# Patient Record
Sex: Male | Born: 1935 | Race: White | Hispanic: No | Marital: Married
Health system: Southern US, Community
[De-identification: ages and names within clinical notes are randomized; demographics above are authoritative.]

## PROBLEM LIST (undated history)

## (undated) DIAGNOSIS — I4891 Unspecified atrial fibrillation: Secondary | ICD-10-CM

## (undated) DIAGNOSIS — F0390 Unspecified dementia without behavioral disturbance: Secondary | ICD-10-CM

## (undated) DIAGNOSIS — N183 Chronic kidney disease, stage 3 unspecified: Secondary | ICD-10-CM

## (undated) DIAGNOSIS — I10 Essential (primary) hypertension: Secondary | ICD-10-CM

---

## 2017-05-23 ENCOUNTER — Encounter (HOSPITAL_COMMUNITY): Payer: Self-pay | Admitting: Emergency Medicine

## 2017-05-23 ENCOUNTER — Inpatient Hospital Stay (HOSPITAL_COMMUNITY): Payer: Medicare Other

## 2017-05-23 ENCOUNTER — Inpatient Hospital Stay (HOSPITAL_COMMUNITY)
Admission: EM | Admit: 2017-05-23 | Discharge: 2017-05-26 | DRG: 536 | Disposition: A | Discharge status: Hospice | Payer: Medicare Other | Attending: Internal Medicine | Admitting: Internal Medicine

## 2017-05-23 ENCOUNTER — Emergency Department (HOSPITAL_COMMUNITY): Payer: Medicare Other

## 2017-05-23 DIAGNOSIS — R402142 Coma scale, eyes open, spontaneous, at arrival to emergency department: Secondary | ICD-10-CM | POA: Diagnosis present

## 2017-05-23 DIAGNOSIS — I480 Paroxysmal atrial fibrillation: Secondary | ICD-10-CM | POA: Diagnosis present

## 2017-05-23 DIAGNOSIS — N183 Chronic kidney disease, stage 3 (moderate): Secondary | ICD-10-CM | POA: Diagnosis present

## 2017-05-23 DIAGNOSIS — Z6824 Body mass index (BMI) 24.0-24.9, adult: Secondary | ICD-10-CM | POA: Diagnosis not present

## 2017-05-23 DIAGNOSIS — D696 Thrombocytopenia, unspecified: Secondary | ICD-10-CM | POA: Diagnosis present

## 2017-05-23 DIAGNOSIS — R627 Adult failure to thrive: Secondary | ICD-10-CM | POA: Diagnosis present

## 2017-05-23 DIAGNOSIS — I4891 Unspecified atrial fibrillation: Secondary | ICD-10-CM | POA: Diagnosis present

## 2017-05-23 DIAGNOSIS — R402242 Coma scale, best verbal response, confused conversation, at arrival to emergency department: Secondary | ICD-10-CM | POA: Diagnosis present

## 2017-05-23 DIAGNOSIS — Z66 Do not resuscitate: Secondary | ICD-10-CM | POA: Diagnosis present

## 2017-05-23 DIAGNOSIS — Z7401 Bed confinement status: Secondary | ICD-10-CM

## 2017-05-23 DIAGNOSIS — L8990 Pressure ulcer of unspecified site, unspecified stage: Secondary | ICD-10-CM | POA: Diagnosis present

## 2017-05-23 DIAGNOSIS — I129 Hypertensive chronic kidney disease with stage 1 through stage 4 chronic kidney disease, or unspecified chronic kidney disease: Secondary | ICD-10-CM | POA: Diagnosis present

## 2017-05-23 DIAGNOSIS — W19XXXA Unspecified fall, initial encounter: Secondary | ICD-10-CM | POA: Diagnosis present

## 2017-05-23 DIAGNOSIS — Z79899 Other long term (current) drug therapy: Secondary | ICD-10-CM | POA: Diagnosis not present

## 2017-05-23 DIAGNOSIS — Y92009 Unspecified place in unspecified non-institutional (private) residence as the place of occurrence of the external cause: Secondary | ICD-10-CM | POA: Diagnosis not present

## 2017-05-23 DIAGNOSIS — Y92129 Unspecified place in nursing home as the place of occurrence of the external cause: Secondary | ICD-10-CM | POA: Diagnosis not present

## 2017-05-23 DIAGNOSIS — Z515 Encounter for palliative care: Secondary | ICD-10-CM | POA: Diagnosis present

## 2017-05-23 DIAGNOSIS — S72001K Fracture of unspecified part of neck of right femur, subsequent encounter for closed fracture with nonunion: Secondary | ICD-10-CM | POA: Diagnosis not present

## 2017-05-23 DIAGNOSIS — S72001A Fracture of unspecified part of neck of right femur, initial encounter for closed fracture: Secondary | ICD-10-CM | POA: Diagnosis present

## 2017-05-23 DIAGNOSIS — F0390 Unspecified dementia without behavioral disturbance: Secondary | ICD-10-CM | POA: Diagnosis present

## 2017-05-23 DIAGNOSIS — Z888 Allergy status to other drugs, medicaments and biological substances status: Secondary | ICD-10-CM | POA: Diagnosis not present

## 2017-05-23 DIAGNOSIS — D62 Acute posthemorrhagic anemia: Secondary | ICD-10-CM | POA: Diagnosis present

## 2017-05-23 DIAGNOSIS — D649 Anemia, unspecified: Secondary | ICD-10-CM | POA: Diagnosis not present

## 2017-05-23 DIAGNOSIS — M25551 Pain in right hip: Secondary | ICD-10-CM | POA: Diagnosis present

## 2017-05-23 DIAGNOSIS — Z7901 Long term (current) use of anticoagulants: Secondary | ICD-10-CM

## 2017-05-23 DIAGNOSIS — S72011A Unspecified intracapsular fracture of right femur, initial encounter for closed fracture: Principal | ICD-10-CM | POA: Diagnosis present

## 2017-05-23 DIAGNOSIS — R402362 Coma scale, best motor response, obeys commands, at arrival to emergency department: Secondary | ICD-10-CM | POA: Diagnosis present

## 2017-05-23 DIAGNOSIS — G309 Alzheimer's disease, unspecified: Secondary | ICD-10-CM | POA: Diagnosis not present

## 2017-05-23 DIAGNOSIS — F0281 Dementia in other diseases classified elsewhere with behavioral disturbance: Secondary | ICD-10-CM | POA: Diagnosis not present

## 2017-05-23 HISTORY — DX: Chronic kidney disease, stage 3 (moderate): N18.3

## 2017-05-23 HISTORY — DX: Chronic kidney disease, stage 3 unspecified: N18.30

## 2017-05-23 HISTORY — DX: Unspecified atrial fibrillation: I48.91

## 2017-05-23 HISTORY — DX: Unspecified dementia, unspecified severity, without behavioral disturbance, psychotic disturbance, mood disturbance, and anxiety: F03.90

## 2017-05-23 HISTORY — DX: Essential (primary) hypertension: I10

## 2017-05-23 LAB — CBC WITH DIFFERENTIAL/PLATELET
BASOS ABS: 0 10*3/uL (ref 0.0–0.1)
BASOS PCT: 0 %
EOS ABS: 0 10*3/uL (ref 0.0–0.7)
Eosinophils Relative: 0 %
HCT: 41 % (ref 39.0–52.0)
HEMOGLOBIN: 13.1 g/dL (ref 13.0–17.0)
Lymphocytes Relative: 5 %
Lymphs Abs: 0.7 10*3/uL (ref 0.7–4.0)
MCH: 27.9 pg (ref 26.0–34.0)
MCHC: 32 g/dL (ref 30.0–36.0)
MCV: 87.4 fL (ref 78.0–100.0)
MONOS PCT: 4 %
Monocytes Absolute: 0.6 10*3/uL (ref 0.1–1.0)
NEUTROS ABS: 12.5 10*3/uL — AB (ref 1.7–7.7)
NEUTROS PCT: 91 %
Platelets: 156 10*3/uL (ref 150–400)
RBC: 4.69 MIL/uL (ref 4.22–5.81)
RDW: 14 % (ref 11.5–15.5)
WBC: 13.8 10*3/uL — AB (ref 4.0–10.5)

## 2017-05-23 LAB — BASIC METABOLIC PANEL
ANION GAP: 11 (ref 5–15)
BUN: 20 mg/dL (ref 6–20)
CALCIUM: 8.8 mg/dL — AB (ref 8.9–10.3)
CHLORIDE: 102 mmol/L (ref 101–111)
CO2: 24 mmol/L (ref 22–32)
CREATININE: 1.24 mg/dL (ref 0.61–1.24)
GFR calc Af Amer: >60 mL/min (ref >60)
GFR calc non Af Amer: 52 mL/min — ABNORMAL LOW (ref >60)
Glucose, Bld: 148 mg/dL — ABNORMAL HIGH (ref 65–99)
Potassium: 4.1 mmol/L (ref 3.5–5.1)
SODIUM: 137 mmol/L (ref 135–145)

## 2017-05-23 MED ORDER — SENNOSIDES-DOCUSATE SODIUM 8.6-50 MG PO TABS
2.0000 | ORAL_TABLET | Freq: Every day | ORAL | Status: DC
  Administered 2017-05-23 – 2017-05-25 (×3): 2 via ORAL
  Filled 2017-05-23 (×3): qty 2

## 2017-05-23 MED ORDER — FUROSEMIDE 20 MG PO TABS: 40.0000 mg | ORAL_TABLET | Freq: Every day | ORAL | Status: DC

## 2017-05-23 MED ORDER — ACETAMINOPHEN 325 MG PO TABS
650.0000 mg | ORAL_TABLET | Freq: Four times a day (QID) | ORAL | Status: DC | PRN
  Administered 2017-05-23 – 2017-05-24 (×2): 650 mg via ORAL
  Filled 2017-05-23 (×2): qty 2

## 2017-05-23 MED ORDER — RESOURCE THICKENUP CLEAR PO POWD
ORAL | Status: DC | PRN
  Filled 2017-05-23: qty 125

## 2017-05-23 MED ORDER — CARVEDILOL 3.125 MG PO TABS
3.1250 mg | ORAL_TABLET | Freq: Two times a day (BID) | ORAL | Status: DC
  Administered 2017-05-23 – 2017-05-26 (×7): 3.125 mg via ORAL
  Filled 2017-05-23 (×8): qty 1

## 2017-05-23 MED ORDER — ALLOPURINOL 100 MG PO TABS
100.0000 mg | ORAL_TABLET | Freq: Every day | ORAL | Status: DC
  Administered 2017-05-23 – 2017-05-26 (×4): 100 mg via ORAL
  Filled 2017-05-23 (×5): qty 1

## 2017-05-23 MED ORDER — FOLIC ACID 1 MG PO TABS
1.0000 mg | ORAL_TABLET | Freq: Every day | ORAL | Status: DC
  Administered 2017-05-23 – 2017-05-26 (×4): 1 mg via ORAL
  Filled 2017-05-23 (×4): qty 1

## 2017-05-23 MED ORDER — ENALAPRIL MALEATE 5 MG PO TABS
2.5000 mg | ORAL_TABLET | Freq: Every day | ORAL | Status: DC
  Administered 2017-05-23 – 2017-05-26 (×4): 2.5 mg via ORAL
  Filled 2017-05-23 (×5): qty 1

## 2017-05-23 MED ORDER — ONDANSETRON HCL 4 MG/2ML IJ SOLN: 4.0000 mg | Freq: Four times a day (QID) | INTRAMUSCULAR | Status: DC | PRN

## 2017-05-23 MED ORDER — MORPHINE SULFATE (PF) 4 MG/ML IV SOLN
2.0000 mg | INTRAVENOUS | Status: DC | PRN
  Administered 2017-05-23 – 2017-05-26 (×4): 2 mg via INTRAVENOUS
  Filled 2017-05-23 (×4): qty 1

## 2017-05-23 MED ORDER — PANTOPRAZOLE SODIUM 40 MG PO TBEC
40.0000 mg | DELAYED_RELEASE_TABLET | Freq: Every day | ORAL | Status: DC
  Administered 2017-05-23 – 2017-05-26 (×4): 40 mg via ORAL
  Filled 2017-05-23 (×4): qty 1

## 2017-05-23 MED ORDER — FENTANYL CITRATE (PF) 100 MCG/2ML IJ SOLN
50.0000 ug | Freq: Once | INTRAMUSCULAR | Status: AC
  Administered 2017-05-23: 50 ug via INTRAVENOUS
  Filled 2017-05-23: qty 2

## 2017-05-23 MED ORDER — LATANOPROST 0.005 % OP SOLN
1.0000 [drp] | Freq: Every day | OPHTHALMIC | Status: DC
  Administered 2017-05-23 – 2017-05-25 (×3): 1 [drp] via OPHTHALMIC
  Filled 2017-05-23: qty 2.5

## 2017-05-23 NOTE — Consult Note (Signed)
ORTHOPAEDIC CONSULTATION  REQUESTING PHYSICIAN: Zannie Cove, MD  PCP:  Patient, No Pcp Per  Chief Complaint: fall  HPI: Brandon Andersen is a 83 y.o. male who complains of right hip pain following a fall at his nursing facility. Brandon Andersen has fairly advanced dementia and the history is obtained from his wife was present at the bedside. He does have advanced dementia and lives in a memory care unit. This is been progressive over the last 10 years. Per his wife he is nonambulatory at baseline only participates in transfers from the bed to his recliner and from the bed to the commode. He has been nonambulatory since last year when he broke his left hip.  Current fall occurred when he tried to get out of bed on his own.  Of note he also has atrial fibrillation and is on Eliquis.  Past Medical History:  Diagnosis Date  . A-fib (HCC)   . Dementia   . Hypertension   . Renal disorder    CKD stage 3   History reviewed. No pertinent surgical history. Social History   Social History  . Marital status: Married    Spouse name: N/A  . Number of children: N/A  . Years of education: N/A   Social History Main Topics  . Smoking status: Unknown If Ever Smoked  . Smokeless tobacco: None  . Alcohol use No  . Drug use: No  . Sexual activity: Not Asked   Other Topics Concern  . None   Social History Narrative  . None   No family history on file. Allergies  Allergen Reactions  . Prednisone    Prior to Admission medications   Not on File   Dg Chest 1 View  Result Date: 05/23/2017 CLINICAL DATA:  Altered mental status.  Pain after a fall. EXAM: CHEST 1 VIEW COMPARISON:  04/14/2016 FINDINGS: Postoperative changes in the mediastinum. Heart size and pulmonary vascularity are normal for technique. Small left pleural effusion with atelectasis or consolidation in the left lung base. Pneumonia not excluded. Right lung appears clear and expanded. No pneumothorax. Calcified and tortuous  aorta. IMPRESSION: Small left pleural effusion with consolidation or atelectasis in the left lower lung. Electronically Signed   By: Burman Nieves M.D.   On: 05/23/2017 02:21   Dg Pelvis 1-2 Views  Result Date: 05/23/2017 CLINICAL DATA:  Pain after a fall tonight. EXAM: PELVIS - 1-2 VIEW COMPARISON:  None. FINDINGS: Diffuse bone demineralization. Acute subcapital fracture of the right proximal femur with varus angulation. Old left hip hemiarthroplasty. Components are incompletely included within the field of view. Pelvis appears intact. SI joints and symphysis pubis are not displaced. Vascular stents, vascular coils, and vascular calcifications are present in the pelvis. IMPRESSION: Transverse subcapital fracture of the proximal right femur with varus angulation. Electronically Signed   By: Burman Nieves M.D.   On: 05/23/2017 02:22   Ct Head Wo Contrast  Result Date: 05/23/2017 CLINICAL DATA:  Fall tonight.  Right femoral fracture.  Dementia. EXAM: CT HEAD WITHOUT CONTRAST CT CERVICAL SPINE WITHOUT CONTRAST TECHNIQUE: Multidetector CT imaging of the head and cervical spine was performed following the standard protocol without intravenous contrast. Multiplanar CT image reconstructions of the cervical spine were also generated. COMPARISON:  04/06/2016 FINDINGS: CT HEAD FINDINGS Brain: Examination is technically limited due to motion artifact. Diffuse cerebral atrophy. Ventricular dilatation consistent with central atrophy. Low-attenuation changes in the deep white matter consistent with small vessel ischemia. No mass effect or midline shift. No  abnormal extra-axial fluid collections. Gray-white matter junctions appear distinct. Basal cisterns are not effaced. No acute intracranial hemorrhage is identified. Vascular: Vascular calcifications are present. Skull: Calvarium appears intact. Sinuses/Orbits: Paranasal sinuses and mastoid air cells are clear. Other: None. CT CERVICAL SPINE FINDINGS Alignment:  There is reversal of the usual cervical lordosis with slight anterior subluxations at C3-4 and C4-5 and slight retrolisthesis at C5-6. Alignment is unchanged since the prior study and is likely due to degenerative changes. Normal alignment of the cervical facet joints. Skull base and vertebrae: Skullbase appears intact. No vertebral compression deformities. No focal bone lesion or bone destruction. Soft tissues and spinal canal: No prevertebral soft tissue swelling. No paraspinal mass or infiltration. Disc levels: Degenerative changes diffusely throughout the cervical spine with narrowed interspaces and associated endplate hypertrophic changes. Degenerative changes throughout the facet joints. C1-2 articulation appears intact. Upper chest: Prominent pleural effusion and pleural thickening similar to previous study dated 08/15/2016. Postoperative median sternotomy. Other: None. IMPRESSION: 1. No acute intracranial abnormalities. Chronic atrophy and small vessel ischemic changes. 2. Degenerative changes throughout the cervical spine. Nonspecific reversal of the usual cervical lordosis. Alignment is unchanged since previous studies. No acute displaced cervical spine fractures identified. 3. Left pleural effusion and pleural thickening is similar to previous studies. Electronically Signed   By: Burman Nieves M.D.   On: 05/23/2017 02:44   Ct Cervical Spine Wo Contrast  Result Date: 05/23/2017 CLINICAL DATA:  Fall tonight.  Right femoral fracture.  Dementia. EXAM: CT HEAD WITHOUT CONTRAST CT CERVICAL SPINE WITHOUT CONTRAST TECHNIQUE: Multidetector CT imaging of the head and cervical spine was performed following the standard protocol without intravenous contrast. Multiplanar CT image reconstructions of the cervical spine were also generated. COMPARISON:  04/06/2016 FINDINGS: CT HEAD FINDINGS Brain: Examination is technically limited due to motion artifact. Diffuse cerebral atrophy. Ventricular dilatation  consistent with central atrophy. Low-attenuation changes in the deep white matter consistent with small vessel ischemia. No mass effect or midline shift. No abnormal extra-axial fluid collections. Gray-white matter junctions appear distinct. Basal cisterns are not effaced. No acute intracranial hemorrhage is identified. Vascular: Vascular calcifications are present. Skull: Calvarium appears intact. Sinuses/Orbits: Paranasal sinuses and mastoid air cells are clear. Other: None. CT CERVICAL SPINE FINDINGS Alignment: There is reversal of the usual cervical lordosis with slight anterior subluxations at C3-4 and C4-5 and slight retrolisthesis at C5-6. Alignment is unchanged since the prior study and is likely due to degenerative changes. Normal alignment of the cervical facet joints. Skull base and vertebrae: Skullbase appears intact. No vertebral compression deformities. No focal bone lesion or bone destruction. Soft tissues and spinal canal: No prevertebral soft tissue swelling. No paraspinal mass or infiltration. Disc levels: Degenerative changes diffusely throughout the cervical spine with narrowed interspaces and associated endplate hypertrophic changes. Degenerative changes throughout the facet joints. C1-2 articulation appears intact. Upper chest: Prominent pleural effusion and pleural thickening similar to previous study dated 08/15/2016. Postoperative median sternotomy. Other: None. IMPRESSION: 1. No acute intracranial abnormalities. Chronic atrophy and small vessel ischemic changes. 2. Degenerative changes throughout the cervical spine. Nonspecific reversal of the usual cervical lordosis. Alignment is unchanged since previous studies. No acute displaced cervical spine fractures identified. 3. Left pleural effusion and pleural thickening is similar to previous studies. Electronically Signed   By: Burman Nieves M.D.   On: 05/23/2017 02:44   Ct Hip Right Wo Contrast  Result Date: 05/23/2017 CLINICAL DATA:   Right hip fracture for additional evaluation. EXAM: CT OF THE RIGHT HIP WITHOUT  CONTRAST TECHNIQUE: Multidetector CT imaging of the right hip was performed according to the standard protocol. Multiplanar CT image reconstructions were also generated. COMPARISON:  Pelvis and right femoral radiographs 05/23/2017 FINDINGS: Bones/Joint/Cartilage Transverse subcapital fracture of the right femoral neck with varus and posterior angulation of the distal fracture fragment. Fracture lines do not extend to the inter trochanteric region. Visualized right hemipelvis and acetabulum appear intact. Incidental note of a left hip arthroplasty. Ligaments Suboptimally assessed by CT. Muscles and Tendons No intramuscular hematoma. Soft tissues Small right hip effusion. Vascular calcifications in the iliac and femoral artery's. Metallic structure demonstrated in the right pelvis. Bladder wall is not thickened and no bladder filling defects are identified. IMPRESSION: Subcapital fracture of the right femoral neck with varus angulation. Electronically Signed   By: Burman NievesWilliam  Stevens M.D.   On: 05/23/2017 06:43   Dg Femur Min 2 Views Right  Result Date: 05/23/2017 CLINICAL DATA:  Altered mental status.  Right leg pain after a fall. EXAM: RIGHT FEMUR 2 VIEWS COMPARISON:  None. FINDINGS: Diffuse bone demineralization. Subcapital fracture of the right femoral neck with varus angulation of the fracture fragments. Right femur appears otherwise intact. Degenerative changes in the right hip joint. Right hemipelvis appears intact. Vascular stents, vascular calcifications, and vascular coils in the pelvis. IMPRESSION: Acute subcapital fracture of the right femoral neck with varus angulation. Electronically Signed   By: Burman NievesWilliam  Stevens M.D.   On: 05/23/2017 02:19    Positive ROS: All other systems have been reviewed and were otherwise negative with the exception of those mentioned in the HPI and as above.  Physical Exam: General:  disoriented, no acute distress Cardiovascular: No pedal edema Respiratory: No cyanosis, no use of accessory musculature GI: No organomegaly, abdomen is soft and non-tender Skin: No lesions in the area of chief complaint Neurologic: Sensation intact distally Psychiatric: Patient is not competent for consent  Lymphatic: No axillary or cervical lymphadenopathy  MUSCULOSKELETAL:  At the right hip he does not respond to logroll with any grimace or pain response. Distally at the foot he has a 2+ dorsalis pedis pulse in the foot is warm and well-perfused. He does not have pain with passive range of motion of the ankle foot or knee. The calf is soft and nontender.  Assessment: 1. Closed right femoral neck fracture.  Plan: - I had a long discussion at the bedside with Mr. Jinny SandersDavis's wife. We discussed that given his nonambulatory status this is a fracture that we wouldrecommend nonoperative management for. We reviewed at length his functional status to ensure that she was on board with this plan. The risks of surgery are outweighed by the small benefits of operating on a nonambulatory patient with a hip fracture. We also reviewed the CT scan of the right hip at bedside to see if he would be appropriate for a less invasive surgery to help with pain. Unfortunately the nature of the fracture would not be receptive to her cutaneous screws. - Thus our recommendation at this time is for pain control over the next 1-2 days to ensure that he will be appropriate for return back to his facility.  If for whatever reason he is unable to have satisfactory pain control while here we would consider changing plans to operative management on Monday. However I do believe that the risk of surgery in this individual is not outweighed by the small benefit. - recommend nonweightbearing to the right lower extremity, he is okay for transfers, and sitting upright. - We will  follow along.     Yolonda Kida, MD Cell [phone]    05/23/2017 7:01 AM

## 2017-05-23 NOTE — Progress Notes (Signed)
Patient seen and examined, admitted earlier this morning by Dr. Julian ReilGardner Basically 83 year old male with history of advanced dementia, overall debility/bedbound status, long term resident of [facility] since his previous hip fracture, atrial fibrillation, chronic kidney disease stage III was admitted after a fall and x-ray which noted nondisplaced right femur fracture. -Orthopedics consulted, seen by Dr. Duwayne HeckJason Rogers, who felt that given patient's nonambulatory status the risks of surgery would be outweighed by the small benefits and hence recommended nonoperative management, pain control and nonweightbearing right lower extremity, okay for transfers etc. -I had a long discussion with patient's wife Mrs. Kling regarding overall debility, advanced dementia, bedbound status and recommended a palliative consultation for goals of care she is agreeable to this. -Patient is DNR  Brandon CovePreetha Ernisha Sorn, MD

## 2017-05-23 NOTE — ED Notes (Signed)
Attempted to call report

## 2017-05-23 NOTE — Progress Notes (Signed)
Palliative Medicine consult noted. Due to high referral volume, there may be a delay seeing this patient. Please call the Palliative Medicine Team office at [phone] if recommendations are needed in the interim.  Thank you for inviting us to see this patient.  Jenan Ellegood G. Finnleigh Marchetti, RN, BSN, CHPN 05/23/2017 4:03 PM Cell [phone] 8:00-4:00 Monday-Friday Office [phone] 

## 2017-05-23 NOTE — ED Provider Notes (Signed)
[facility] Provider Note   CSN: [id number] Arrival date & time: 05/23/17  0055     History   Chief Complaint Chief Complaint  Patient presents with  . Fall    HPI Brandon HandlerDonald L Hilscher is a 83 y.o. male.  Patient with past medical history remarkable for dementia, hypertension, [facility] ED, and A. Fib on eliquis presents to the ED with a chief complaint of fall.  He stays at a memory unit in a nursing home and fell from standing after trying to get out of his wheel chair.  He reportedly had an x-ray of his right thigh that showed a femur fracture.  He states that he doesn't have any pain.  He is accompanied by family members.  He is DNR.  Hx is limited 2/2 dementia.  Level 5 caveat applies.   The history is provided by the patient. No language interpreter was used.    Past Medical History:  Diagnosis Date  . A-fib (HCC)   . Dementia   . Hypertension   . Renal disorder    CKD stage 3    There are no active problems to display for this patient.   History reviewed. No pertinent surgical history.     Home Medications    Prior to Admission medications   Not on File    Family History No family history on file.  Social History Social History  Substance Use Topics  . Smoking status: Unknown If Ever Smoked  . Smokeless tobacco: Not on file  . Alcohol use No     Allergies   Prednisone   Review of Systems Review of Systems  Unable to perform ROS: Dementia     Physical Exam Updated Vital Signs BP 115/72 (BP Location: Right Arm)   Pulse (!) 117   Temp 97.6 F (36.4 C) (Oral)   Resp 16   SpO2 95%   Physical Exam  Constitutional: He is oriented to person, place, and time. He appears well-developed and well-nourished.  HENT:  Head: Normocephalic and atraumatic.  Eyes: Pupils are equal, round, and reactive to light. Conjunctivae and EOM are normal. Right eye exhibits no discharge. Left eye exhibits no discharge. No scleral  icterus.  Neck: Normal range of motion. Neck supple. No JVD present.  Cardiovascular: Regular rhythm, normal heart sounds and intact distal pulses.  Exam reveals no gallop and no friction rub.   No murmur heard. tachycardic  Pulmonary/Chest: Effort normal and breath sounds normal. No respiratory distress. He has no wheezes. He has no rales. He exhibits no tenderness.  Abdominal: Soft. He exhibits no distension and no mass. There is no tenderness. There is no rebound and no guarding.  Musculoskeletal: Normal range of motion. He exhibits no edema or tenderness.  Right lower extremity externally rotated and shortened ROM and strength deferred  Neurological: He is alert and oriented to person, place, and time.  Sensation intact  Skin: Skin is warm and dry.  No open fracture  Psychiatric: He has a normal mood and affect. His behavior is normal. Judgment and thought content normal.  Nursing note and vitals reviewed.    ED Treatments / Results  Labs (all labs ordered are listed, but only abnormal results are displayed) Labs Reviewed  CBC WITH DIFFERENTIAL/PLATELET  BASIC METABOLIC PANEL    EKG  EKG Interpretation None       Radiology No results found.  Procedures Procedures (including critical care time)  Medications Ordered in ED Medications -  No data to display   Initial Impression / Assessment and Plan / ED Course  I have reviewed the triage vital signs and the nursing notes.  Pertinent labs & imaging results that were available during my care of the patient were reviewed by me and considered in my medical decision making (see chart for details).     Patient with mechanical fall.  Reported femur fracture.  Will check labs and imaging.  Right-sided subcapital femur fracture. No other apparent injuries.  Patient discussed with Dr. Aundria Rud from orthopedics, who recommends hospitalist admission.  Appreciate Dr. Julian Reil for admitting the patient.  Final Clinical  Impressions(s) / ED Diagnoses   Final diagnoses:  Closed subcapital fracture of right femur, initial encounter [facility])    New Prescriptions New Prescriptions   No medications on file     Roxy Horseman, PA-C 05/23/17 0344    Derwood Kaplan, MD 05/23/17 [phone]

## 2017-05-23 NOTE — ED Triage Notes (Signed)
Per EMS, pt from [facility] after fall tonight. Xray from facility shows nondisplaced right femur fx. Hx dementia. VSS.

## 2017-05-23 NOTE — Progress Notes (Signed)
Initial Nutrition Assessment  DOCUMENTATION CODES:   Not applicable  INTERVENTION:  Provide Magic cup TID with meals, each supplement provides 290 kcal and 9 grams of protein.  Recommend providing new height and weight measurement.   Encourage adequate PO intake.   NUTRITION DIAGNOSIS:   Increased nutrient needs related to acute illness as evidenced by estimated needs.  GOAL:   Patient will meet greater than or equal to 90% of their needs  MONITOR:   PO intake, Supplement acceptance, Labs, Weight trends, Skin, I & O's  REASON FOR ASSESSMENT:   Consult Hip fracture protocol  ASSESSMENT:   83 year old male with history of advanced dementia, overall debility/bedbound status, long term resident of [facility] since his previous hip fracture, atrial fibrillation, chronic kidney disease stage III was admitted after a fall and x-ray which noted nondisplaced right femur fracture.   Orthopedics MD recommends non operative management. No family at bedside. Pt baseline confused per RN. Pt did reports to RD that he is eating well at meals. Pt currently on a dysphagia 1 diet with honey thick liquids. RD to order Magic cup to aid in adequate nutrition. Noted no new weight or height measurement. Recommend obtaining weight and height.  Nutrition-Focused physical exam completed. Findings are mild to moderate fat depletion, no muscle depletion, and no edema.   Labs and medications reviewed.   Diet Order:  DIET - DYS 1 Room service appropriate? Yes; Fluid consistency: Honey Thick  Skin:  Wound (see comment) (Stage I to L heel)  Last BM:  Unknown  Height:   Ht Readings from Last 1 Encounters:  No data found for Ht    Weight:   Wt Readings from Last 1 Encounters:  No data found for Wt    Ideal Body Weight:    N/A  BMI:  There is no height or weight on file to calculate BMI.  Estimated Nutritional Needs:   Kcal:  1800-1950  Protein:  70-80 grams  Fluid:  1.8 - 1.9  L/day  EDUCATION NEEDS:   No education needs identified at this time  Roslyn SmilingStephanie Jerianne Anselmo, MS, RD, LDN Pager # [phone] After hours/ weekend pager # [phone]

## 2017-05-23 NOTE — H&P (Signed)
History and Physical    Brandon Andersen ZOX:[id number] DOB: June 14, 1932 DOA: 05/23/2017  PCP: Patient, No Pcp Per  Patient coming from: [facility]  I have personally briefly reviewed patient's old medical records in [facility]  Chief Complaint: Fall  HPI: Brandon Andersen is a 83 y.o. male with medical history significant of Dementia, a.fib, HTN, CKD stage 3.  Patient presents to ED from his memory care unit at [facility] after a fall tonight.  X ray from facility shows R femoral neck fx.   ED Course: R hip fx confirmed on our X ray.   Review of Systems: Limited secondary to dementia.  Denies pain right now.  Past Medical History:  Diagnosis Date  . A-fib (HCC)   . Dementia   . Hypertension   . Renal disorder    CKD stage 3    History reviewed. No pertinent surgical history.   reports that he does not drink alcohol or use drugs. His tobacco history is not on file.  Allergies  Allergen Reactions  . Prednisone     No family history on file.   Prior to Admission medications   Not on File    Physical Exam: Vitals:   05/23/17 0108 05/23/17 0109 05/23/17 0224 05/23/17 0302  BP: 115/72  125/81 127/79  Pulse: (!) 117  (!) 40   Resp: 16  (!) 22 (!) 24  Temp: 97.6 F (36.4 C)     TempSrc: Oral     SpO2: (!) 88% 95% 93% 94%    Constitutional: NAD, calm, comfortable Eyes: PERRL, lids and conjunctivae normal ENMT: Mucous membranes are moist. Posterior pharynx clear of any exudate or lesions.Normal dentition.  Neck: normal, supple, no masses, no thyromegaly Respiratory: clear to auscultation bilaterally, no wheezing, no crackles. Normal respiratory effort. No accessory muscle use.  Cardiovascular: Tachycardic, no murmurs / rubs / gallops. No extremity edema. 2+ pedal pulses. No carotid bruits.  Abdomen: no tenderness, no masses palpated. No hepatosplenomegaly. Bowel sounds positive.  Musculoskeletal: RLE shortened and rotated Skin: no rashes, lesions, ulcers. No  induration Neurologic: Sensation intact Psychiatric: Demented    Labs on Admission: I have personally reviewed following labs and imaging studies  CBC:  Recent Labs Lab 05/23/17 0110  WBC 13.8*  NEUTROABS 12.5*  HGB 13.1  HCT 41.0  MCV 87.4  PLT 156   Basic Metabolic Panel:  Recent Labs Lab 05/23/17 0110  NA 137  K 4.1  CL 102  CO2 24  GLUCOSE 148*  BUN 20  CREATININE 1.24  CALCIUM 8.8*   GFR: CrCl cannot be calculated (Unknown ideal weight.). Liver Function Tests: No results for input(s): AST, ALT, ALKPHOS, BILITOT, PROT, ALBUMIN in the last 168 hours. No results for input(s): LIPASE, AMYLASE in the last 168 hours. No results for input(s): AMMONIA in the last 168 hours. Coagulation Profile: No results for input(s): INR, PROTIME in the last 168 hours. Cardiac Enzymes: No results for input(s): CKTOTAL, CKMB, CKMBINDEX, TROPONINI in the last 168 hours. BNP (last 3 results) No results for input(s): PROBNP in the last 8760 hours. HbA1C: No results for input(s): HGBA1C in the last 72 hours. CBG: No results for input(s): GLUCAP in the last 168 hours. Lipid Profile: No results for input(s): CHOL, HDL, LDLCALC, TRIG, CHOLHDL, LDLDIRECT in the last 72 hours. Thyroid Function Tests: No results for input(s): TSH, T4TOTAL, FREET4, T3FREE, THYROIDAB in the last 72 hours. Anemia Panel: No results for input(s): VITAMINB12, FOLATE, FERRITIN, TIBC, IRON, RETICCTPCT in the last  72 hours. Urine analysis: No results found for: COLORURINE, APPEARANCEUR, LABSPEC, PHURINE, GLUCOSEU, HGBUR, BILIRUBINUR, KETONESUR, PROTEINUR, UROBILINOGEN, NITRITE, LEUKOCYTESUR  Radiological Exams on Admission: Dg Chest 1 View  Result Date: 05/23/2017 CLINICAL DATA:  Altered mental status.  Pain after a fall. EXAM: CHEST 1 VIEW COMPARISON:  04/14/2016 FINDINGS: Postoperative changes in the mediastinum. Heart size and pulmonary vascularity are normal for technique. Small left pleural effusion with  atelectasis or consolidation in the left lung base. Pneumonia not excluded. Right lung appears clear and expanded. No pneumothorax. Calcified and tortuous aorta. IMPRESSION: Small left pleural effusion with consolidation or atelectasis in the left lower lung. Electronically Signed   By: Burman Nieves M.D.   On: 05/23/2017 02:21   Dg Pelvis 1-2 Views  Result Date: 05/23/2017 CLINICAL DATA:  Pain after a fall tonight. EXAM: PELVIS - 1-2 VIEW COMPARISON:  None. FINDINGS: Diffuse bone demineralization. Acute subcapital fracture of the right proximal femur with varus angulation. Old left hip hemiarthroplasty. Components are incompletely included within the field of view. Pelvis appears intact. SI joints and symphysis pubis are not displaced. Vascular stents, vascular coils, and vascular calcifications are present in the pelvis. IMPRESSION: Transverse subcapital fracture of the proximal right femur with varus angulation. Electronically Signed   By: Burman Nieves M.D.   On: 05/23/2017 02:22   Ct Head Wo Contrast  Result Date: 05/23/2017 CLINICAL DATA:  Fall tonight.  Right femoral fracture.  Dementia. EXAM: CT HEAD WITHOUT CONTRAST CT CERVICAL SPINE WITHOUT CONTRAST TECHNIQUE: Multidetector CT imaging of the head and cervical spine was performed following the standard protocol without intravenous contrast. Multiplanar CT image reconstructions of the cervical spine were also generated. COMPARISON:  04/06/2016 FINDINGS: CT HEAD FINDINGS Brain: Examination is technically limited due to motion artifact. Diffuse cerebral atrophy. Ventricular dilatation consistent with central atrophy. Low-attenuation changes in the deep white matter consistent with small vessel ischemia. No mass effect or midline shift. No abnormal extra-axial fluid collections. Gray-white matter junctions appear distinct. Basal cisterns are not effaced. No acute intracranial hemorrhage is identified. Vascular: Vascular calcifications are  present. Skull: Calvarium appears intact. Sinuses/Orbits: Paranasal sinuses and mastoid air cells are clear. Other: None. CT CERVICAL SPINE FINDINGS Alignment: There is reversal of the usual cervical lordosis with slight anterior subluxations at C3-4 and C4-5 and slight retrolisthesis at C5-6. Alignment is unchanged since the prior study and is likely due to degenerative changes. Normal alignment of the cervical facet joints. Skull base and vertebrae: Skullbase appears intact. No vertebral compression deformities. No focal bone lesion or bone destruction. Soft tissues and spinal canal: No prevertebral soft tissue swelling. No paraspinal mass or infiltration. Disc levels: Degenerative changes diffusely throughout the cervical spine with narrowed interspaces and associated endplate hypertrophic changes. Degenerative changes throughout the facet joints. C1-2 articulation appears intact. Upper chest: Prominent pleural effusion and pleural thickening similar to previous study dated 08/15/2016. Postoperative median sternotomy. Other: None. IMPRESSION: 1. No acute intracranial abnormalities. Chronic atrophy and small vessel ischemic changes. 2. Degenerative changes throughout the cervical spine. Nonspecific reversal of the usual cervical lordosis. Alignment is unchanged since previous studies. No acute displaced cervical spine fractures identified. 3. Left pleural effusion and pleural thickening is similar to previous studies. Electronically Signed   By: Burman Nieves M.D.   On: 05/23/2017 02:44   Ct Cervical Spine Wo Contrast  Result Date: 05/23/2017 CLINICAL DATA:  Fall tonight.  Right femoral fracture.  Dementia. EXAM: CT HEAD WITHOUT CONTRAST CT CERVICAL SPINE WITHOUT CONTRAST TECHNIQUE: Multidetector CT imaging  of the head and cervical spine was performed following the standard protocol without intravenous contrast. Multiplanar CT image reconstructions of the cervical spine were also generated. COMPARISON:   04/06/2016 FINDINGS: CT HEAD FINDINGS Brain: Examination is technically limited due to motion artifact. Diffuse cerebral atrophy. Ventricular dilatation consistent with central atrophy. Low-attenuation changes in the deep white matter consistent with small vessel ischemia. No mass effect or midline shift. No abnormal extra-axial fluid collections. Gray-white matter junctions appear distinct. Basal cisterns are not effaced. No acute intracranial hemorrhage is identified. Vascular: Vascular calcifications are present. Skull: Calvarium appears intact. Sinuses/Orbits: Paranasal sinuses and mastoid air cells are clear. Other: None. CT CERVICAL SPINE FINDINGS Alignment: There is reversal of the usual cervical lordosis with slight anterior subluxations at C3-4 and C4-5 and slight retrolisthesis at C5-6. Alignment is unchanged since the prior study and is likely due to degenerative changes. Normal alignment of the cervical facet joints. Skull base and vertebrae: Skullbase appears intact. No vertebral compression deformities. No focal bone lesion or bone destruction. Soft tissues and spinal canal: No prevertebral soft tissue swelling. No paraspinal mass or infiltration. Disc levels: Degenerative changes diffusely throughout the cervical spine with narrowed interspaces and associated endplate hypertrophic changes. Degenerative changes throughout the facet joints. C1-2 articulation appears intact. Upper chest: Prominent pleural effusion and pleural thickening similar to previous study dated 08/15/2016. Postoperative median sternotomy. Other: None. IMPRESSION: 1. No acute intracranial abnormalities. Chronic atrophy and small vessel ischemic changes. 2. Degenerative changes throughout the cervical spine. Nonspecific reversal of the usual cervical lordosis. Alignment is unchanged since previous studies. No acute displaced cervical spine fractures identified. 3. Left pleural effusion and pleural thickening is similar to previous  studies. Electronically Signed   By: Burman NievesWilliam  Stevens M.D.   On: 05/23/2017 02:44   Dg Femur Min 2 Views Right  Result Date: 05/23/2017 CLINICAL DATA:  Altered mental status.  Right leg pain after a fall. EXAM: RIGHT FEMUR 2 VIEWS COMPARISON:  None. FINDINGS: Diffuse bone demineralization. Subcapital fracture of the right femoral neck with varus angulation of the fracture fragments. Right femur appears otherwise intact. Degenerative changes in the right hip joint. Right hemipelvis appears intact. Vascular stents, vascular calcifications, and vascular coils in the pelvis. IMPRESSION: Acute subcapital fracture of the right femoral neck with varus angulation. Electronically Signed   By: Burman NievesWilliam  Stevens M.D.   On: 05/23/2017 02:19    EKG: Independently reviewed.  Assessment/Plan Principal Problem:   Closed right hip fracture, initial encounter (HCC) Active Problems:   Dementia   A-fib (HCC)    1. Closed R hip fx - 1. Hip fx pathway 2. Pain control with IV morphine PRN 3. Holding eliquis, surgery likely wont be today due to eliquis use per EDP discussion with Dr. Aundria Rudogers 4. Goal of repair would be to increase bed mobility and transfers, he currently does not ambulate at baseline since he broke the other hip last year. 2. Dementia - 1. Chronic, stable, in memory care unit at baseline 3. A.Fib - 1. Continue coreg 2. Hold eliquis 4. HTN - 1. continue vasotec 2. will hold lasix for now  DVT prophylaxis: SCDs Code Status: DNR Family Communication: Family at bedside Disposition Plan: SNF after admit Consults called: EDP spoke with Dr. Aundria Rudogers Admission status: Admit to inpatient   Hillary BowGARDNER, Stefon Ramthun M. DO [facility] Pager [phone]  If 7AM-7PM, please contact day team taking care of patient [email or web address] Password [email or web address]  05/23/2017, 4:24 AM

## 2017-05-24 DIAGNOSIS — Z515 Encounter for palliative care: Secondary | ICD-10-CM

## 2017-05-24 DIAGNOSIS — G309 Alzheimer's disease, unspecified: Secondary | ICD-10-CM

## 2017-05-24 DIAGNOSIS — I480 Paroxysmal atrial fibrillation: Secondary | ICD-10-CM

## 2017-05-24 DIAGNOSIS — D696 Thrombocytopenia, unspecified: Secondary | ICD-10-CM

## 2017-05-24 DIAGNOSIS — S72001K Fracture of unspecified part of neck of right femur, subsequent encounter for closed fracture with nonunion: Secondary | ICD-10-CM

## 2017-05-24 DIAGNOSIS — F0281 Dementia in other diseases classified elsewhere with behavioral disturbance: Secondary | ICD-10-CM

## 2017-05-24 DIAGNOSIS — D62 Acute posthemorrhagic anemia: Secondary | ICD-10-CM

## 2017-05-24 DIAGNOSIS — I4891 Unspecified atrial fibrillation: Secondary | ICD-10-CM

## 2017-05-24 DIAGNOSIS — S72001A Fracture of unspecified part of neck of right femur, initial encounter for closed fracture: Secondary | ICD-10-CM

## 2017-05-24 LAB — BASIC METABOLIC PANEL
ANION GAP: 7 (ref 5–15)
BUN: 18 mg/dL (ref 6–20)
CALCIUM: 8.6 mg/dL — AB (ref 8.9–10.3)
CO2: 28 mmol/L (ref 22–32)
CREATININE: 1.25 mg/dL — AB (ref 0.61–1.24)
Chloride: 103 mmol/L (ref 101–111)
GFR calc Af Amer: 60 mL/min — ABNORMAL LOW (ref >60)
GFR calc non Af Amer: 51 mL/min — ABNORMAL LOW (ref >60)
GLUCOSE: 112 mg/dL — AB (ref 65–99)
Potassium: 4.3 mmol/L (ref 3.5–5.1)
Sodium: 138 mmol/L (ref 135–145)

## 2017-05-24 LAB — CBC
HEMATOCRIT: 36.9 % — AB (ref 39.0–52.0)
Hemoglobin: 11.6 g/dL — ABNORMAL LOW (ref 13.0–17.0)
MCH: 27.8 pg (ref 26.0–34.0)
MCHC: 31.4 g/dL (ref 30.0–36.0)
MCV: 88.3 fL (ref 78.0–100.0)
Platelets: 134 10*3/uL — ABNORMAL LOW (ref 150–400)
RBC: 4.18 MIL/uL — ABNORMAL LOW (ref 4.22–5.81)
RDW: 14.2 % (ref 11.5–15.5)
WBC: 8.6 10*3/uL (ref 4.0–10.5)

## 2017-05-24 MED ORDER — APIXABAN 2.5 MG PO TABS
2.5000 mg | ORAL_TABLET | Freq: Two times a day (BID) | ORAL | Status: DC
  Administered 2017-05-24 – 2017-05-26 (×5): 2.5 mg via ORAL
  Filled 2017-05-24 (×5): qty 1

## 2017-05-24 MED ORDER — FUROSEMIDE 40 MG PO TABS
40.0000 mg | ORAL_TABLET | Freq: Every day | ORAL | Status: DC
  Administered 2017-05-24 – 2017-05-26 (×3): 40 mg via ORAL
  Filled 2017-05-24 (×3): qty 1

## 2017-05-24 NOTE — Care Management Note (Signed)
Case Management Note  Patient Details  Name: Brandon Andersen MRN: [id number] Date of Birth: 11/28/1931  Subjective/Objective:                 Patient admitted from [facility], with medical history significant of Dementia, a.fib, HTN, CKD stage 3.  Patient presents to ED [facility] after a [facility] tonight.  X ray from facility shows R femoral neck fx.   Action/Plan:  CM/ CSW will continue to follow for DC planning.  Expected Discharge Date:                  Expected Discharge Plan:  Skilled Nursing Facility  In-House Referral:  Clinical Social Work  Discharge planning Services  CM Consult  Post Acute Care Choice:    Choice offered to:     DME Arranged:    DME Agency:     HH Arranged:    HH Agency:     Status of Service:  In process, will continue to follow  If discussed at Long Length of Stay Meetings, dates discussed:    Additional Comments:  Lawerance SabalDebbie Tulip Meharg, RN 05/24/2017, 8:37 AM

## 2017-05-24 NOTE — Consult Note (Signed)
WOC Nurse wound consult note Reason for Consult:Deep tissue injury to bilateral buttock in fold. Wife at bedside and states [facility] has been treating him for pressure injuries in this area.  Wounds are intact, maroon discoloration.   Wound type:pressure Pressure Injury POA: Yes Measurement: 3 cm x 0.3 cm intact maroon discoloration with blistering Wound ZOX:WRUEAVbed:intact maroon Drainage (amount, consistency, odor) none Periwound:intact Dressing procedure/placement/frequency:Cleanse wounds to buttocks with soap and water daily.  Turn and reposition every two hours.  Will order mattress replacement with low air loss feature.  Will not follow at this time.  Please re-consult if needed.  Maple HudsonKaren Blessings Inglett RN BSN [facility]ON Pager [phone]

## 2017-05-24 NOTE — Progress Notes (Signed)
Noted bilateral buttocks with blister dark colored maroon, foam dressing applied.,WOC consult done.

## 2017-05-24 NOTE — Consult Note (Signed)
Consultation Note Date: 05/24/2017   Patient Name: Brandon Andersen  DOB: June 18, 1932  MRN: [id number]  Age / Sex: 83 y.o., male  PCP: Patient, No Pcp Per Referring Physician: Modena Jansky, MD  Reason for Consultation: Establishing goals of care, Hospice Evaluation, Pain control and Psychosocial/spiritual support  HPI/Patient Profile: 83 y.o. male  with past medical history of Advanced dementia, debility/bedbound status, history of left hip fracture,, atrial fibrillation on anticoagulation admitted on 05/23/2017 after a fall where he sustained a right hip fracture. Patient is a resident of a skilled nursing facility because of advanced dementia. When he broke his left hip he has not walked since that time. He is minimally conversant. Orthopedic  services were notified. Family does not wish to pursue surgery given his bedbound status as well as overall poor quality of life secondary to dementia  Consult for goals of care  Clinical Assessment and Goals of Care: Met with patient, patient's wife as well as review chart. Patient is unable to participate in goals of care secondary to advanced dementia. Spoke at length to patient's wife who shares that his disease progression with dementia has been long and very difficult. As noted above, patient fell and broke his left hip and has been living in a nursing home since that time. He did have his hip repaired but still never regained or improved his functional status and has been bed bound since. He has now fallen again, and sustained a right hip fracture. Family does not wish to pursue surgery. Patient is also eating only bites and sips of pured food. Patient appears to be in a great deal of pain with any sort of movement and is noticed to be grimacing at rest.  Patient's healthcare proxy is his wife, Brandon Andersen [phone] She and her husband have 4 children  together, 3 of which live locally and are described as a good source of support    SUMMARY OF RECOMMENDATIONS   Confirm DNR/DNI No surgical repair of right hip fracture. Medical management only with adequate analgesia, opioids as indicated Nursing care orders were written to  premedicate patient prior to ADLs, bathing secondary to pain No feeding tube Return to [facility] skilled nursing facility when medically maximized with hospice support in the facility Order placed to social work to facilitate hospice support upon discharge in skilled [place]:  DNR    Symptom Management:   Pain: Continue with morphine but strongly suggest the patient be premedicated prior to any ADLs. Patient is in a lot of pain with movement and is grimacing at rest. Continue morphine 2 mg IV every 4 hours as needed. Monitor for need for scheduled dosing  Palliative Prophylaxis:   Aspiration, Bowel Regimen, Delirium Protocol, Eye Care, Frequent Pain Assessment, Oral Care and Turn Reposition  Additional Recommendations (Limitations, Scope, Preferences):  Avoid Hospitalization, Minimize Medications, Initiate Comfort Feeding, No Artificial Feeding, No Chemotherapy, No Hemodialysis, No Radiation, No Surgical Procedures and No Tracheostomy  Psycho-social/Spiritual:   Desire for further Chaplaincy  support:no  Additional Recommendations: Grief/Bereavement Support  Prognosis:   < 6 months in the setting of advanced dementia now with new right hip fracture (no surgical repair), history of left hip fracture with surgical repair, atrial fib; only eating bites and sips  Discharge Planning: [place] with Hospice      Primary Diagnoses: Present on Admission: . Dementia . A-fib ([place]) . Closed right hip fracture, initial encounter ([place])   I have reviewed the medical record, interviewed the patient and family, and examined the patient. The following  aspects are pertinent.  Past Medical History:  Diagnosis Date  . A-fib ([place])   . CKD (chronic kidney disease), stage III ([place])   . Dementia   . Hypertension    Social History   Social History  . Marital status: Married    Spouse name: N/A  . Number of children: N/A  . Years of education: N/A   Social History Main Topics  . Smoking status: Unknown If Ever Smoked  . Smokeless tobacco: None  . Alcohol use No  . Drug use: No  . Sexual activity: Not Asked   Other Topics Concern  . None   Social History Narrative  . None   No family history on file. Scheduled Meds: . allopurinol  100 mg Oral Daily  . apixaban  2.5 mg Oral BID  . carvedilol  3.125 mg Oral BID WC  . enalapril  2.5 mg Oral Daily  . folic acid  1 mg Oral Daily  . furosemide  40 mg Oral Daily  . latanoprost  1 drop Both Eyes QHS  . pantoprazole  40 mg Oral Daily  . senna-docusate  2 tablet Oral QHS   Continuous Infusions: PRN Meds:.acetaminophen, morphine injection, ondansetron (ZOFRAN) IV, RESOURCE THICKENUP CLEAR Medications Prior to Admission:  Prior to Admission medications   Medication Sig Start Date End Date Taking? Authorizing Provider  acetaminophen (TYLENOL) 500 MG tablet Take 1,000 mg by mouth every 6 (six) hours as needed for mild pain.   Yes [provider]  allopurinol (ZYLOPRIM) 100 MG tablet Take 100 mg by mouth daily.   Yes [provider]  apixaban (ELIQUIS) 2.5 MG TABS tablet Take 2.5 mg by mouth 2 (two) times daily.   Yes [provider]  carvedilol (COREG) 3.125 MG tablet Take 3.125 mg by mouth 2 (two) times daily with a meal.   Yes [provider]  enalapril (VASOTEC) 2.5 MG tablet Take 2.5 mg by mouth daily.   Yes [provider]  folic acid (FOLVITE) 1 MG tablet Take 1 mg by mouth daily.   Yes [provider]  furosemide (LASIX) 40 MG tablet Take 40 mg by mouth daily.   Yes [provider]  ketoconazole (NIZORAL) 2 %  shampoo Apply 1 application topically 2 (two) times a week.   Yes [provider]  latanoprost (XALATAN) 0.005 % ophthalmic solution Place 1 drop into both eyes at bedtime.   Yes [provider]  Menthol-Zinc Oxide (CALMOSEPTINE) 0.44-20.6 % OINT Apply 1 application topically 3 (three) times daily.   Yes [provider]  nitroGLYCERIN (NITROSTAT) 0.4 MG SL tablet Place 0.4 mg under the tongue every 5 (five) minutes as needed for chest pain.   Yes [provider]  omeprazole (PRILOSEC) 20 MG capsule Take 20 mg by mouth daily.   Yes [provider]  senna-docusate (SENOKOT-S) 8.6-50 MG tablet Take 1 tablet by mouth daily.   Yes [provider]  Sodium Fluoride (PREVIDENT 5000 BOOSTER PLUS) 1.1 % PSTE Place 1 application onto teeth at bedtime.   Yes [provider]   Allergies  Allergen Reactions  . Prednisone    Review of Systems  Unable to perform ROS: Dementia    Physical Exam  Constitutional:  Cachectic, frail elderly man  HENT:  Temporal wasting  Cardiovascular: Normal rate.   Pulmonary/Chest: Effort normal.  Neurological:  Somnolent Cries out in pain with any turning in the bed Minimally conversant  Skin: Skin is warm and dry. There is pallor.  Psychiatric:  Unable to test secondary to end-stage dementia  Nursing note and vitals reviewed.   Vital Signs: BP 136/78 (BP Location: Left Arm)   Pulse (!) 111   Temp 98.6 F (37 C) (Axillary)   Resp 17   SpO2 91%  Pain Assessment: Faces   Pain Score: 0-No pain   SpO2: SpO2: 91 % O2 Device:SpO2: 91 % O2 Flow Rate: .O2 Flow Rate (L/min): 4.5 L/min  IO: Intake/output summary:  Intake/Output Summary (Last 24 hours) at 05/24/17 1633 Last data filed at 05/24/17 1300  Gross per 24 hour  Intake              230 ml  Output                0 ml  Net              230 ml    LBM:   Baseline Weight:   Most recent weight:       Palliative  Assessment/Data:   Flowsheet Rows     Most Recent Value  Intake Tab  Referral Department  Hospitalist  Unit at Time of Referral  Med/Surg Unit  Palliative Care Primary Diagnosis  Trauma  Date Notified  05/23/17  Palliative Care Type  New Palliative care  Reason for referral  Clarify Goals of Care, Counsel Regarding Hospice  Date of Admission  05/21/17  Date first seen by Palliative Care  05/24/17  # of days Palliative referral response time  1 Day(s)  # of days IP prior to Palliative referral  2  Clinical Assessment  Palliative Performance Scale Score  30%  Pain Max last 24 hours  Not able to report  Pain Min Last 24 hours  Not able to report  Dyspnea Max Last 24 Hours  Not able to report  Dyspnea Min Last 24 hours  Not able to report  Nausea Max Last 24 Hours  Not able to report  Nausea Min Last 24 Hours  Not able to report  Anxiety Max Last 24 Hours  Not able to report  Anxiety Min Last 24 Hours  Not able to report  Other Max Last 24 Hours  Not able to report  Psychosocial & Spiritual Assessment  Palliative Care Outcomes  Patient/Family meeting held?  Yes  Who was at the meeting?  pt's wife  Palliative Care Outcomes  Clarified goals of care  Patient/Family wishes: Interventions discontinued/not started   Mechanical Ventilation, BiPAP, Tube feedings/TPN, Hemodialysis, Transfusion, Trach, PEG, Vasopressors      Time In: 1530 Time Out: 1644 Time Total: 74 min Greater than 50%  of this time was spent counseling and coordinating care related to the above assessment and plan.  Signed by: Dory Horn, NP   Please contact Palliative Medicine Team phone at [phone] for questions and concerns.  For individual provider: See Shea Evans

## 2017-05-24 NOTE — Progress Notes (Signed)
Pain is well controlled.  Patient demented and nonverbal No pain with logroll of hip 2+ DP Does not cooperate with motor or sensory exam  A/P: R femoral neck fx, nonambulator, dementia -Nonoperative treatment -NWB RLE -DVT ppx: on Eliquis chronically -PO pain control -F/u in 4 weeks PRN

## 2017-05-24 NOTE — Progress Notes (Signed)
PROGRESS NOTE   Brandon Andersen  ZOX:[id number]    DOB: 1931-11-21    DOA: 05/23/2017  PCP: Patient, No Pcp Per   I have briefly reviewed patients previous medical records in [facility].  Brief Narrative:  83 year old male with advanced dementia, overall debility/bedbound status, long-term resident of SNF since his previous hip fracture, A. fib, stage III chronic kidney disease was admitted after a fall and nondisplaced right femur fracture. Given patient's nonambulatory status and risks of surgery outweighing benefits, orthopedics recommended nonoperative management, pain control, nonweightbearing on right lower extremity and okay for transfers. Family okay with this plan. Palliative care input pending.   Assessment & Plan:   Principal Problem:   Closed right hip fracture, initial encounter [facility]) Active Problems:   Dementia   A-fib (HCC)   Right femoral neck fracture As discussed above, nonoperative treatment, nonweightbearing on right lower extremity, pain control. Follow-up with orthopedics in 4 weeks PRN Palliative input pending.  Advanced dementia Mental status may be at baseline. Nonverbal and does not follow commands.  Paroxysmal A. Fib In sinus rhythm on telemetry. Continue carvedilol and resume Eliquis now that nonsurgical treatment planned.  Essential hypertension Controlled.  Adult failure to thrive Multifactorial secondary to advanced age, frailty, advanced dementia and multiple significant comorbidities.  Anemia and thrombocytopenia May be related to acute blood loss from fracture. Follow CBC in a.m.  Stage III chronic kidney disease Creatinine stable.     DVT prophylaxis: Resumed Eliquis Code Status: DO NOT RESUSCITATE Family Communication: None at bedside Disposition: ? Return to prior SNF, will consult clinical social worker to see if they can manage his level of care.   Consultants:  Orthopedics Palliative care team-pending    Procedures:   None  Antimicrobials:  None    Subjective: Patient sitting up in bed. Looks around but doesn't track. Nonverbal. Does not follow instructions. As per nursing tech at bedside, eating very little and pockets food and drinks.   ROS: Unable due to advanced dementia.  Objective:  Vitals:   05/23/17 1303 05/23/17 1415 05/23/17 2100 05/24/17 0700  BP: 129/88 135/81 (!) 106/57 128/62  Pulse: (!) 110 (!) 106 (!) 102 92  Resp: (!) 22 18 18 17   Temp: 100.1 F (37.8 C) 98.7 F (37.1 C) 99.7 F (37.6 C) 98.9 F (37.2 C)  TempSrc: Axillary Axillary Oral Oral  SpO2: 97% 100% (!) 89% 92%    Examination:  General exam: Elderly male, moderately built, frail and chronically ill-looking, sitting up comfortably in bed. Respiratory system: Clear to auscultation. Respiratory effort normal. Cardiovascular system: S1 & S2 heard, RRR. No JVD, murmurs, rubs, gallops or clicks. No pedal edema. Telemetry: Sinus rhythm, discontinued 10/20. Gastrointestinal system: Abdomen is nondistended, soft and nontender. No organomegaly or masses felt. Normal bowel sounds heard. Central nervous system: Alert but not oriented. Mental status as described above. No focal neurological deficits. Extremities: Has pill-rolling tremors of both hands, right >left. Purposeful movements of his hands where he pulls the sheet to cover himself. Skin: No rashes, lesions or ulcers Psychiatry: Judgement and insight impaired. Mood & affect flat.     Data Reviewed: I have personally reviewed following labs and imaging studies  CBC:  Recent Labs Lab 05/23/17 0110 05/24/17 0632  WBC 13.8* 8.6  NEUTROABS 12.5*  --   HGB 13.1 11.6*  HCT 41.0 36.9*  MCV 87.4 88.3  PLT 156 134*   Basic Metabolic Panel:  Recent Labs Lab 05/23/17 0110 05/24/17 0632  NA 137  138  K 4.1 4.3  CL 102 103  CO2 24 28  GLUCOSE 148* 112*  BUN 20 18  CREATININE 1.24 1.25*  CALCIUM 8.8* 8.6*      Radiology Studies: Dg Chest 1  View  Result Date: 05/23/2017 CLINICAL DATA:  Altered mental status.  Pain after a fall. EXAM: CHEST 1 VIEW COMPARISON:  04/14/2016 FINDINGS: Postoperative changes in the mediastinum. Heart size and pulmonary vascularity are normal for technique. Small left pleural effusion with atelectasis or consolidation in the left lung base. Pneumonia not excluded. Right lung appears clear and expanded. No pneumothorax. Calcified and tortuous aorta. IMPRESSION: Small left pleural effusion with consolidation or atelectasis in the left lower lung. Electronically Signed   By: Burman Nieves M.D.   On: 05/23/2017 02:21   Dg Pelvis 1-2 Views  Result Date: 05/23/2017 CLINICAL DATA:  Pain after a fall tonight. EXAM: PELVIS - 1-2 VIEW COMPARISON:  None. FINDINGS: Diffuse bone demineralization. Acute subcapital fracture of the right proximal femur with varus angulation. Old left hip hemiarthroplasty. Components are incompletely included within the field of view. Pelvis appears intact. SI joints and symphysis pubis are not displaced. Vascular stents, vascular coils, and vascular calcifications are present in the pelvis. IMPRESSION: Transverse subcapital fracture of the proximal right femur with varus angulation. Electronically Signed   By: Burman Nieves M.D.   On: 05/23/2017 02:22   Ct Head Wo Contrast  Result Date: 05/23/2017 CLINICAL DATA:  Fall tonight.  Right femoral fracture.  Dementia. EXAM: CT HEAD WITHOUT CONTRAST CT CERVICAL SPINE WITHOUT CONTRAST TECHNIQUE: Multidetector CT imaging of the head and cervical spine was performed following the standard protocol without intravenous contrast. Multiplanar CT image reconstructions of the cervical spine were also generated. COMPARISON:  04/06/2016 FINDINGS: CT HEAD FINDINGS Brain: Examination is technically limited due to motion artifact. Diffuse cerebral atrophy. Ventricular dilatation consistent with central atrophy. Low-attenuation changes in the deep white matter  consistent with small vessel ischemia. No mass effect or midline shift. No abnormal extra-axial fluid collections. Gray-white matter junctions appear distinct. Basal cisterns are not effaced. No acute intracranial hemorrhage is identified. Vascular: Vascular calcifications are present. Skull: Calvarium appears intact. Sinuses/Orbits: Paranasal sinuses and mastoid air cells are clear. Other: None. CT CERVICAL SPINE FINDINGS Alignment: There is reversal of the usual cervical lordosis with slight anterior subluxations at C3-4 and C4-5 and slight retrolisthesis at C5-6. Alignment is unchanged since the prior study and is likely due to degenerative changes. Normal alignment of the cervical facet joints. Skull base and vertebrae: Skullbase appears intact. No vertebral compression deformities. No focal bone lesion or bone destruction. Soft tissues and spinal canal: No prevertebral soft tissue swelling. No paraspinal mass or infiltration. Disc levels: Degenerative changes diffusely throughout the cervical spine with narrowed interspaces and associated endplate hypertrophic changes. Degenerative changes throughout the facet joints. C1-2 articulation appears intact. Upper chest: Prominent pleural effusion and pleural thickening similar to previous study dated 08/15/2016. Postoperative median sternotomy. Other: None. IMPRESSION: 1. No acute intracranial abnormalities. Chronic atrophy and small vessel ischemic changes. 2. Degenerative changes throughout the cervical spine. Nonspecific reversal of the usual cervical lordosis. Alignment is unchanged since previous studies. No acute displaced cervical spine fractures identified. 3. Left pleural effusion and pleural thickening is similar to previous studies. Electronically Signed   By: Burman Nieves M.D.   On: 05/23/2017 02:44   Ct Cervical Spine Wo Contrast  Result Date: 05/23/2017 CLINICAL DATA:  Fall tonight.  Right femoral fracture.  Dementia. EXAM: CT HEAD  WITHOUT  CONTRAST CT CERVICAL SPINE WITHOUT CONTRAST TECHNIQUE: Multidetector CT imaging of the head and cervical spine was performed following the standard protocol without intravenous contrast. Multiplanar CT image reconstructions of the cervical spine were also generated. COMPARISON:  04/06/2016 FINDINGS: CT HEAD FINDINGS Brain: Examination is technically limited due to motion artifact. Diffuse cerebral atrophy. Ventricular dilatation consistent with central atrophy. Low-attenuation changes in the deep white matter consistent with small vessel ischemia. No mass effect or midline shift. No abnormal extra-axial fluid collections. Gray-white matter junctions appear distinct. Basal cisterns are not effaced. No acute intracranial hemorrhage is identified. Vascular: Vascular calcifications are present. Skull: Calvarium appears intact. Sinuses/Orbits: Paranasal sinuses and mastoid air cells are clear. Other: None. CT CERVICAL SPINE FINDINGS Alignment: There is reversal of the usual cervical lordosis with slight anterior subluxations at C3-4 and C4-5 and slight retrolisthesis at C5-6. Alignment is unchanged since the prior study and is likely due to degenerative changes. Normal alignment of the cervical facet joints. Skull base and vertebrae: Skullbase appears intact. No vertebral compression deformities. No focal bone lesion or bone destruction. Soft tissues and spinal canal: No prevertebral soft tissue swelling. No paraspinal mass or infiltration. Disc levels: Degenerative changes diffusely throughout the cervical spine with narrowed interspaces and associated endplate hypertrophic changes. Degenerative changes throughout the facet joints. C1-2 articulation appears intact. Upper chest: Prominent pleural effusion and pleural thickening similar to previous study dated 08/15/2016. Postoperative median sternotomy. Other: None. IMPRESSION: 1. No acute intracranial abnormalities. Chronic atrophy and small vessel ischemic changes. 2.  Degenerative changes throughout the cervical spine. Nonspecific reversal of the usual cervical lordosis. Alignment is unchanged since previous studies. No acute displaced cervical spine fractures identified. 3. Left pleural effusion and pleural thickening is similar to previous studies. Electronically Signed   By: Burman NievesWilliam  Stevens M.D.   On: 05/23/2017 02:44   Ct Hip Right Wo Contrast  Result Date: 05/23/2017 CLINICAL DATA:  Right hip fracture for additional evaluation. EXAM: CT OF THE RIGHT HIP WITHOUT CONTRAST TECHNIQUE: Multidetector CT imaging of the right hip was performed according to the standard protocol. Multiplanar CT image reconstructions were also generated. COMPARISON:  Pelvis and right femoral radiographs 05/23/2017 FINDINGS: Bones/Joint/Cartilage Transverse subcapital fracture of the right femoral neck with varus and posterior angulation of the distal fracture fragment. Fracture lines do not extend to the inter trochanteric region. Visualized right hemipelvis and acetabulum appear intact. Incidental note of a left hip arthroplasty. Ligaments Suboptimally assessed by CT. Muscles and Tendons No intramuscular hematoma. Soft tissues Small right hip effusion. Vascular calcifications in the iliac and femoral artery's. Metallic structure demonstrated in the right pelvis. Bladder wall is not thickened and no bladder filling defects are identified. IMPRESSION: Subcapital fracture of the right femoral neck with varus angulation. Electronically Signed   By: Burman NievesWilliam  Stevens M.D.   On: 05/23/2017 06:43   Dg Femur Min 2 Views Right  Result Date: 05/23/2017 CLINICAL DATA:  Altered mental status.  Right leg pain after a fall. EXAM: RIGHT FEMUR 2 VIEWS COMPARISON:  None. FINDINGS: Diffuse bone demineralization. Subcapital fracture of the right femoral neck with varus angulation of the fracture fragments. Right femur appears otherwise intact. Degenerative changes in the right hip joint. Right hemipelvis  appears intact. Vascular stents, vascular calcifications, and vascular coils in the pelvis. IMPRESSION: Acute subcapital fracture of the right femoral neck with varus angulation. Electronically Signed   By: Burman NievesWilliam  Stevens M.D.   On: 05/23/2017 02:19        Scheduled Meds: .  allopurinol  100 mg Oral Daily  . carvedilol  3.125 mg Oral BID WC  . enalapril  2.5 mg Oral Daily  . folic acid  1 mg Oral Daily  . latanoprost  1 drop Both Eyes QHS  . pantoprazole  40 mg Oral Daily  . senna-docusate  2 tablet Oral QHS   Continuous Infusions:   LOS: 1 day     Matisse Salais, MD, FACP, FHM. [facility] Pager [phone]  If 7PM-7AM, please contact night-coverage [email or web address] Password [email or web address] 05/24/2017, 12:17 PM

## 2017-05-24 NOTE — Progress Notes (Signed)
Orthopedic Tech Progress Note Patient Details:  Julianne HandlerDonald L Guillet [birth year] [id number]  Ortho Devices Ortho Device/Splint Location: Unable to use frame properly   Saul FordyceJennifer C Susan Arana 05/24/2017, 12:21 PM

## 2017-05-25 DIAGNOSIS — D649 Anemia, unspecified: Secondary | ICD-10-CM

## 2017-05-25 LAB — CBC
HEMATOCRIT: 36.3 % — AB (ref 39.0–52.0)
Hemoglobin: 11.4 g/dL — ABNORMAL LOW (ref 13.0–17.0)
MCH: 27.6 pg (ref 26.0–34.0)
MCHC: 31.4 g/dL (ref 30.0–36.0)
MCV: 87.9 fL (ref 78.0–100.0)
PLATELETS: 123 10*3/uL — AB (ref 150–400)
RBC: 4.13 MIL/uL — ABNORMAL LOW (ref 4.22–5.81)
RDW: 14.5 % (ref 11.5–15.5)
WBC: 9 10*3/uL (ref 4.0–10.5)

## 2017-05-25 MED ORDER — ACETAMINOPHEN 325 MG PO TABS: 650.0000 mg | ORAL_TABLET | Freq: Four times a day (QID) | ORAL | Status: AC | PRN

## 2017-05-25 NOTE — Progress Notes (Addendum)
Subjective: Pt resting comfortably.  No apparent pain with palpation of the right hip.  Spoke with Dr. Rexene EdisonH.  [facility]  Palliative care involved.  Pt already on chronic anticoag.  Objective: Vital signs in last 24 hours: Temp:  [97.6 F (36.4 C)-100.9 F (38.3 C)] 97.6 F (36.4 C) (10/21 0500) Pulse Rate:  [78-111] 78 (10/21 0500) Resp:  [17-18] 18 (10/21 0500) BP: (111-136)/(56-78) 130/67 (10/21 0500) SpO2:  [91 %-94 %] 94 % (10/21 0500) Weight:  [78.2 kg (172 lb 6.4 oz)] 78.2 kg (172 lb 6.4 oz) (10/20 1737)  Intake/Output from previous day: 10/20 0701 - 10/21 0700 In: 110 [P.O.:110] Out: -  Intake/Output this shift: Total I/O In: 60 [P.O.:60] Out: -    Recent Labs  05/23/17 0110 05/24/17 0632 05/25/17 0629  HGB 13.1 11.6* 11.4*    Recent Labs  05/24/17 0632 05/25/17 0629  WBC 8.6 9.0  RBC 4.18* 4.13*  HCT 36.9* 36.3*  PLT 134* 123*    Recent Labs  05/23/17 0110 05/24/17 0632  NA 137 138  K 4.1 4.3  CL 102 103  CO2 24 28  BUN 20 18  CREATININE 1.24 1.25*  GLUCOSE 148* 112*  CALCIUM 8.8* 8.6*   No results for input(s): LABPT, INR in the last 72 hours.  Elderly male in nad.  Motor function in tact at right foot.  R Hip with mild swelling and no ecchymosis.    Assessment/Plan: Non op treatment of right hip fx.  NWB on R LE.  DVT prophylaxis with eliquus.  Tylenol for pain.   OK for discharge from ortho standpoint.  F/u with Dr. Linna CapriceSwinteck PRN in a month.  D/c orders entered.   Toni ArthursHEWITT, Kaylor Simenson 05/25/2017, 10:22 AM

## 2017-05-25 NOTE — Progress Notes (Signed)
PROGRESS NOTE   LUAN MABERRY  UJW:[id number]    DOB: [birth year]    DOA: 05/23/2017  PCP: Patient, No Pcp Per   I have briefly reviewed patients previous medical records in [facility].  Brief Narrative:  83 year old male with advanced dementia, overall debility/bedbound status, long-term resident of SNF since his previous hip fracture, A. fib, stage III chronic kidney disease was admitted after a fall and nondisplaced right femur fracture. Given patient's nonambulatory status and risks of surgery outweighing benefits, orthopedics recommended nonoperative management, pain control, nonweightbearing on right lower extremity and okay for transfers. Family okay with this plan. Palliative care input pending.   Assessment & Plan:   Principal Problem:   Closed right hip fracture, initial encounter [facility]) Active Problems:   Dementia   A-fib (HCC)   Palliative care by specialist   Right femoral neck fracture As discussed above, nonoperative treatment, nonweightbearing on right lower extremity, pain control. Patient already on anticoagulation PTA. Follow-up with orthopedics in 4 weeks PRN. Discussed with Dr. Victorino Dike. Palliative input 10/20 appreciated. Discussed with palliative care team. Okay to return to prior SNF with hospice. As discussed with clinical social worker today, SNF will not be able to take patient back until 10/22.  Advanced dementia Mental status may be at baseline. Speaking a little more today.  Paroxysmal A. Fib Continue carvedilol and resumed Eliquis now that nonsurgical treatment planned.  Essential hypertension Controlled.  Adult failure to thrive Multifactorial secondary to advanced age, frailty, advanced dementia and multiple significant comorbidities. Palliative care input appreciated and to return to SNF with hospice.  Anemia and thrombocytopenia May be related to acute blood loss from fracture. Follow CBC in a.m.  Stage III chronic kidney  disease Creatinine stable.     DVT prophylaxis: Resumed Eliquis Code Status: DO NOT RESUSCITATE Family Communication: None at bedside Disposition: Return to prior SNF with hospice on 10/22..   Consultants:  Orthopedics Palliative care team    Procedures:  None  Antimicrobials:  None    Subjective: Patient alert. More verbal. Oriented to self and place. Denies complaints by saying "no". As per nursing tech, ate 75% of breakfast this morning. Also said a word or 2.   ROS: Unable due to advanced dementia.  Objective:  Vitals:   05/24/17 1408 05/24/17 1737 05/24/17 1900 05/25/17 0500  BP: 136/78  (!) 111/56 130/67  Pulse: (!) 111  98 78  Resp: 17  18 18   Temp: 98.6 F (37 C)  (!) 100.9 F (38.3 C) 97.6 F (36.4 C)  TempSrc: Axillary  Oral Oral  SpO2: 91%  94% 94%  Weight:  78.2 kg (172 lb 6.4 oz)    Height:  5\' 10"  (1.778 m)      Examination:  General exam: Elderly male, moderately built, frail and chronically ill-looking, sitting up comfortably in bed. Respiratory system: Clear to auscultation. Respiratory effort normal. Cardiovascular system: S1 & S2 heard, RRR. No JVD, murmurs, rubs, gallops or clicks. No pedal edema. Gastrointestinal system: Abdomen is nondistended, soft and nontender. No organomegaly or masses felt. Normal bowel sounds heard. Central nervous system: Alert and oriented 2. Mental status as described above. No focal neurological deficits. Extremities: Has pill-rolling tremors of both hands, right >left.  Skin: No rashes, lesions or ulcers Psychiatry: Judgement and insight impaired. Mood & affect flat.     Data Reviewed: I have personally reviewed following labs and imaging studies  CBC:  Recent Labs Lab 05/23/17 0110 05/24/17 0632 05/25/17 0629  WBC  13.8* 8.6 9.0  NEUTROABS 12.5*  --   --   HGB 13.1 11.6* 11.4*  HCT 41.0 36.9* 36.3*  MCV 87.4 88.3 87.9  PLT 156 134* 123*   Basic Metabolic Panel:  Recent Labs Lab  05/23/17 0110 05/24/17 0632  NA 137 138  K 4.1 4.3  CL 102 103  CO2 24 28  GLUCOSE 148* 112*  BUN 20 18  CREATININE 1.24 1.25*  CALCIUM 8.8* 8.6*      Radiology Studies: No results found.      Scheduled Meds: . allopurinol  100 mg Oral Daily  . apixaban  2.5 mg Oral BID  . carvedilol  3.125 mg Oral BID WC  . enalapril  2.5 mg Oral Daily  . folic acid  1 mg Oral Daily  . furosemide  40 mg Oral Daily  . latanoprost  1 drop Both Eyes QHS  . pantoprazole  40 mg Oral Daily  . senna-docusate  2 tablet Oral QHS   Continuous Infusions:   LOS: 2 days     Adilen Pavelko, MD, FACP, FHM. [facility] Pager [phone]  If 7PM-7AM, please contact night-coverage [email or web address] Password [email or web address] 05/25/2017, 1:58 PM

## 2017-05-26 DIAGNOSIS — L8990 Pressure ulcer of unspecified site, unspecified stage: Secondary | ICD-10-CM | POA: Diagnosis present

## 2017-05-26 LAB — CBC
HEMATOCRIT: 36.9 % — AB (ref 39.0–52.0)
HEMOGLOBIN: 11.5 g/dL — AB (ref 13.0–17.0)
MCH: 27.5 pg (ref 26.0–34.0)
MCHC: 31.2 g/dL (ref 30.0–36.0)
MCV: 88.3 fL (ref 78.0–100.0)
Platelets: 129 10*3/uL — ABNORMAL LOW (ref 150–400)
RBC: 4.18 MIL/uL — ABNORMAL LOW (ref 4.22–5.81)
RDW: 14.4 % (ref 11.5–15.5)
WBC: 6.7 10*3/uL (ref 4.0–10.5)

## 2017-05-26 MED ORDER — MORPHINE SULFATE (CONCENTRATE) 10 MG /0.5 ML PO SOLN: 5.0000 mg | ORAL | 0 refills | Status: AC | PRN

## 2017-05-26 MED ORDER — RESOURCE THICKENUP CLEAR PO POWD: ORAL | Status: AC

## 2017-05-26 NOTE — Care Management Important Message (Signed)
Important Message  Patient Details  Name: Brandon Andersen MRN: [id number] Date of Birth: 12/03/1931   Medicare Important Message Given:  Yes    Meztli Llanas 05/26/2017, 1:07 PM

## 2017-05-26 NOTE — Progress Notes (Signed)
[facility]. Anticipated discharge date: 05/26/17  Family notified: Jinny SandersBetty Culbreath, spouse Transportation by: Sharin MonsPTAR  Nurse to call report to [phone].    CSW signing off.  Abigail ButtsSusan Sephira Zellman, LCSWA  Clinical Social Worker

## 2017-05-26 NOTE — [place] FL2 (Signed)
[place] MEDICAID FL2 LEVEL OF CARE SCREENING TOOL     IDENTIFICATION  Patient Name: Brandon Andersen Birthdate: 01/27/1932 Sex: male Admission Date (Current Location): 05/23/2017  [facility]   Facility and Address:  [facility] [place], [address], [place], [place] [address]      Provider Number: [id number]  Attending Physician Name and Address:  Elease EtienneHongalgi, Anand D, MD  Relative Name and Phone Number:  Jinny SandersBetty Hoeppner, spouse, [phone]    Current Level of Care: Hospital Recommended Level of Care: Skilled Nursing Facility Prior Approval Number:    Date Approved/Denied:   PASRR Number: [id number]  Discharge Plan: SNF    Current Diagnoses: Patient Active Problem List   Diagnosis Date Noted  . Pressure injury of skin 05/26/2017  . Palliative care by specialist   . Dementia 05/23/2017  . A-fib (HCC) 05/23/2017  . Closed right hip fracture, initial encounter (HCC) 05/23/2017    Orientation RESPIRATION BLADDER Height & Weight        O2 (nasal cannual 4.5) Incontinent Weight: 172 lb 6.4 oz (78.2 kg) Height:  5\' 10"  (177.8 cm)  BEHAVIORAL SYMPTOMS/MOOD NEUROLOGICAL BOWEL NUTRITION STATUS        Diet (please see DC summary)  AMBULATORY STATUS COMMUNICATION OF NEEDS Skin    (baseline) Verbally PU Stage and Appropriate Care (PU stage 1 L heel, foam dressing; PU buttocks, foam dressing)                       Personal Care Assistance Level of Assistance   (baseline)           Functional Limitations Info             SPECIAL CARE FACTORS FREQUENCY                       Contractures      Additional Factors Info  Code Status, Allergies Code Status Info: DNR Allergies Info: prednisone           Current Medications (05/26/2017):  This is the current hospital active medication list Current Facility-Administered Medications  Medication Dose Route Frequency Provider Last Rate Last Dose  .  acetaminophen (TYLENOL) tablet 650 mg  650 mg Oral Q6H PRN Zannie CoveJoseph, Preetha, MD   650 mg at 05/24/17 2217  . allopurinol (ZYLOPRIM) tablet 100 mg  100 mg Oral Daily Lyda PeroneGardner, Jared M, DO   100 mg at 05/26/17 1133  . apixaban (ELIQUIS) tablet 2.5 mg  2.5 mg Oral BID Marcellus ScottHongalgi, Anand D, MD   2.5 mg at 05/26/17 1133  . carvedilol (COREG) tablet 3.125 mg  3.125 mg Oral BID WC Lyda PeroneGardner, Jared M, DO   3.125 mg at 05/26/17 0900  . enalapril (VASOTEC) tablet 2.5 mg  2.5 mg Oral Daily Lyda PeroneGardner, Jared M, DO   2.5 mg at 05/26/17 1132  . folic acid (FOLVITE) tablet 1 mg  1 mg Oral Daily Lyda PeroneGardner, Jared M, DO   1 mg at 05/26/17 1134  . furosemide (LASIX) tablet 40 mg  40 mg Oral Daily Elease EtienneHongalgi, Anand D, MD   40 mg at 05/26/17 1134  . latanoprost (XALATAN) 0.005 % ophthalmic solution 1 drop  1 drop Both Eyes QHS Lyda PeroneGardner, Jared M, DO   1 drop at 05/25/17 2235  . morphine 4 MG/ML injection 2 mg  2 mg Intravenous Q4H PRN Hillary BowGardner, Jared M, DO   2 mg at 05/26/17 0542  .  ondansetron (ZOFRAN) injection 4 mg  4 mg Intravenous Q6H PRN Zannie Cove, MD      . pantoprazole (PROTONIX) EC tablet 40 mg  40 mg Oral Daily Lyda Perone M, DO   40 mg at 05/26/17 1132  . RESOURCE THICKENUP CLEAR   Oral PRN Zannie Cove, MD      . senna-docusate (Senokot-S) tablet 2 tablet  2 tablet Oral QHS Hillary Bow, DO   2 tablet at 05/25/17 2235     Discharge Medications: Please see discharge summary for a list of discharge medications.  Relevant Imaging Results:  Relevant Lab Results:   Additional Information SSN: [id number]  Abigail Butts, LCSW

## 2017-05-26 NOTE — Clinical Social Work Note (Signed)
Clinical Social Work Assessment  Patient Details  Name: Brandon Andersen MRN: [id number] Date of Birth: 05/04/1932  Date of referral:  05/26/17               Reason for consult:  Facility Placement (from [facility])                Permission sought to share information with:  Facility Medical sales representativeContact Representative, Family Supports Permission granted to share information::  No (patient disoriented)  Name::     Jinny SandersBetty Safer  Agency::  [facility]  Relationship::  spouse  Contact Information:  [phone]  Housing/Transportation Living arrangements for the past 2 months:  Skilled Nursing Facility Source of Information:  Spouse, Facility Patient Interpreter Needed:  None Criminal Activity/Legal Involvement Pertinent to Current Situation/Hospitalization:  No - Comment as needed Significant Relationships:  Spouse Lives with:  Facility Resident Do you feel safe going back to the place where you live?  Yes Need for family participation in patient care:  Yes (Comment)  Care giving concerns: [facility]. Plan to return to SNF with hospice.   Social Worker assessment / plan: CSW discussed discharge plan with patient's wife via phone. Wife wants patient to return to SNF with hospice following. [facility]. [facility] and updated SNF on plan. SNF ready to take patient back today. CSW to support with discharge.  Employment status:  Retired Health and safety inspectornsurance information:  Medicare PT Recommendations:  Not assessed at this time Information / Referral to community resources:  Skilled Nursing Facility  Patient/Family's Response to care: Family appreciative of care.  Patient/Family's Understanding of and Emotional Response to Diagnosis, Current Treatment, and Prognosis: Spouse with understanding of patient's condition and recommendation for hospice. Spouse agreeable to discharge to SNF with hospice.  Emotional Assessment Appearance:   Appears stated age Attitude/Demeanor/Rapport:  Unable to Assess Affect (typically observed):  Unable to Assess Orientation:    Alcohol / Substance use:  Not Applicable Psych involvement (Current and /or in the community):  No (Comment)  Discharge Needs  Concerns to be addressed:  Discharge Planning Concerns Readmission within the last 30 days:  No Current discharge risk:  Physical Impairment, Cognitively Impaired Barriers to Discharge:  Continued Medical Work up   Abigail ButtsSusan Deangela Randleman, LCSW 05/26/2017, 1:59 PM

## 2017-05-26 NOTE — Progress Notes (Signed)
Patient taken to snf via transport all orders and report called to snf from previous shift nurse.

## 2017-05-26 NOTE — Clinical Social Work Placement (Signed)
   CLINICAL SOCIAL WORK PLACEMENT  NOTE  Date:  05/26/2017  Patient Details  Name: Brandon HandlerDonald L Decaire MRN: [id number] Date of Birth: 24-Jan-1932  Clinical Social Work is seeking post-discharge placement for this patient at the Skilled  Nursing Facility level of care (*CSW will initial, date and re-position this form in  chart as items are completed):  Yes   Patient/family provided with [facility] Clinical Social Work Department's list of facilities offering this level of care within the geographic area requested by the patient (or if unable, by the patient's family).  Yes   Patient/family informed of their freedom to choose among providers that offer the needed level of care, that participate in Medicare, Medicaid or managed care program needed by the patient, have an available bed and are willing to accept the patient.  Yes   Patient/family informed of [facility]'s ownership interest in [facility] and [facility], as well as of the fact that they are under no obligation to receive care at these facilities.  PASRR submitted to EDS on       PASRR number received on       Existing PASRR number confirmed on 05/26/17     FL2 transmitted to all facilities in geographic area requested by pt/family on 05/26/17     FL2 transmitted to all facilities within larger geographic area on       Patient informed that his/her managed care company has contracts with or will negotiate with certain facilities, including the following:  [facility]     Yes   Patient/family informed of bed offers received.  [facility]     Physician recommends and patient chooses bed at      [facility] on 05/[facility]/17.  Patient to be transferred to facility by [facility]     Patient family notified on 05/26/17 of transfer.  Name of family member notified:  Jinny SandersBetty Beyene, spouse     PHYSICIAN Please prepare priority discharge  summary, including medications, Please sign DNR, Please sign FL2, Please prepare prescriptions     Additional Comment:    _______________________________________________ Abigail ButtsSusan Satoya Feeley, LCSW 05/26/2017, 2:11 PM

## 2017-05-26 NOTE — Discharge Instructions (Addendum)

## 2017-05-26 NOTE — Discharge Summary (Addendum)
Physician Discharge Summary  Brandon Andersen ZOX:[id number] DOB: 05/14/1932  PCP: Patient, No Pcp Per  Admit date: 05/23/2017 Discharge date: 05/26/2017  Recommendations for Outpatient Follow-up:  1. M.D. at SNF with repeat labs (CBC & BMP) in 1 week if aggressive monitoring is desired by family. 2. [facility] will continue to follow patient at SNF. 3. Dr. Samson Frederic, Orthopedics in 4 weeks as needed.  Home Health: None Equipment/Devices: None    Discharge Condition: Improved and stable  CODE STATUS: DO NOT RESUSCITATE  Diet recommendation: Dysphagia 1 diet and honey thickened liquids.  Discharge Diagnoses:  Principal Problem:   Closed right hip fracture, initial encounter [facility]) Active Problems:   Dementia   A-fib (HCC)   Palliative care by specialist   Pressure injury of skin   Brief Summary: 83 year old male with advanced dementia, overall debility/bedbound status, long-term resident of SNF since his previous hip fracture, A. fib, stage III chronic kidney disease was admitted after a fall and nondisplaced right femur fracture. Given patient's nonambulatory status and risks of surgery outweighing benefits, orthopedics recommended nonoperative management, pain control, nonweightbearing on right lower extremity and okay for transfers. Family okay with this plan. Palliative care was consulted.   Assessment & Plan:  Right femoral neck fracture As discussed above, nonoperative treatment, nonweightbearing on right lower extremity, pain control. Patient already on anticoagulation PTA. Follow-up with orthopedics in 4 weeks PRN.  Palliative input 10/20 appreciated. Discussed with palliative care team. Okay to return to prior SNF with hospice.  Patient received a couple doses of IV morphine in the hospital. Will add oral morphine liquid for pain control at discharge.  Advanced dementia Mental status likely at baseline.  Paroxysmal A.  Fib Continue carvedilol and resumed Eliquis now that nonsurgical treatment planned.  Essential hypertension Controlled.  Adult failure to thrive Multifactorial secondary to advanced age, frailty, advanced dementia and multiple significant comorbidities. Palliative care input appreciated and to return to SNF with hospice.  Anemia and thrombocytopenia May be related to acute blood loss from fracture. Stable. Follow CBC as outpatient if aggressive monitoring desired by family.  Stage III chronic kidney disease Creatinine stable.   Consultants:  Orthopedics Palliative care team    Procedures:  None   Discharge Instructions  Discharge Instructions    (HEART FAILURE PATIENTS) Call MD:  Anytime you have any of the following symptoms: 1) 3 pound weight gain in 24 hours or 5 pounds in 1 week 2) shortness of breath, with or without a dry hacking cough 3) swelling in the hands, feet or stomach 4) if you have to sleep on extra pillows at night in order to breathe.    Complete by:  As directed    Call MD for:  difficulty breathing, headache or visual disturbances    Complete by:  As directed    Call MD for:  extreme fatigue    Complete by:  As directed    Call MD for:  persistant dizziness or light-headedness    Complete by:  As directed    Call MD for:  persistant nausea and vomiting    Complete by:  As directed    Call MD for:  severe uncontrolled pain    Complete by:  As directed    Call MD for:  temperature >100.4    Complete by:  As directed    Diet - low sodium heart healthy    Complete by:  As directed    Discharge instructions  Complete by:  As directed    Diet: Dysphagia 1 diet and honey thickened liquids.   Increase activity slowly    Complete by:  As directed    Non weight bearing    Complete by:  As directed    Laterality:  right   Extremity:  Lower       Medication List    TAKE these medications   acetaminophen 325 MG tablet Commonly known as:   TYLENOL Take 2 tablets (650 mg total) by mouth every 6 (six) hours as needed for mild pain or moderate pain. What changed:  medication strength  how much to take  reasons to take this   allopurinol 100 MG tablet Commonly known as:  ZYLOPRIM Take 100 mg by mouth daily.   CALMOSEPTINE 0.44-20.6 % Oint Generic drug:  Menthol-Zinc Oxide Apply 1 application topically 3 (three) times daily.   carvedilol 3.125 MG tablet Commonly known as:  COREG Take 3.125 mg by mouth 2 (two) times daily with a meal.   ELIQUIS 2.5 MG Tabs tablet Generic drug:  apixaban Take 2.5 mg by mouth 2 (two) times daily.   enalapril 2.5 MG tablet Commonly known as:  VASOTEC Take 2.5 mg by mouth daily.   folic acid 1 MG tablet Commonly known as:  FOLVITE Take 1 mg by mouth daily.   furosemide 40 MG tablet Commonly known as:  LASIX Take 40 mg by mouth daily.   ketoconazole 2 % shampoo Commonly known as:  NIZORAL Apply 1 application topically 2 (two) times a week.   latanoprost 0.005 % ophthalmic solution Commonly known as:  XALATAN Place 1 drop into both eyes at bedtime.   morphine CONCENTRATE 10 mg / 0.5 ml concentrated solution Take 0.25 mLs (5 mg total) by mouth every 4 (four) hours as needed for severe pain.   nitroGLYCERIN 0.4 MG SL tablet Commonly known as:  NITROSTAT Place 0.4 mg under the tongue every 5 (five) minutes as needed for chest pain.   omeprazole 20 MG capsule Commonly known as:  PRILOSEC Take 20 mg by mouth daily.   PREVIDENT 5000 BOOSTER PLUS 1.1 % Pste Generic drug:  Sodium Fluoride Place 1 application onto teeth at bedtime.   RESOURCE THICKENUP CLEAR Powd Oral as needed for liquids. Honey thickened.   senna-docusate 8.6-50 MG tablet Commonly known as:  Senokot-S Take 1 tablet by mouth daily.       Contact information for follow-up providers    Samson FredericSwinteck, Brian, MD Follow up in 4 week(s).   Specialty:  Orthopedic Surgery Why:  as needed. Contact  information: [address] [place] [address] [phone]        M.D. at SNF Follow up.   Why:  Please follow repeat labs (CBC & BMP) in 1 week if aggressive monitoring is desired by family. [facility] will [facility] to [facility] patient at [facility]F.           Contact information for after-discharge care    Destination    [facility] Follow up.   Specialty:  Skilled Nursing Facility Contact information: [address] [place] [id number] [phone]                 Allergies  Allergen Reactions  . Prednisone       Procedures/Studies: Dg Chest 1 View  Result Date: 05/23/2017 CLINICAL DATA:  Altered mental status.  Pain after a fall. EXAM: CHEST 1 VIEW COMPARISON:  04/14/2016 FINDINGS: Postoperative changes in the mediastinum. Heart size and pulmonary vascularity are normal for technique. Small left pleural effusion with atelectasis or consolidation in the left lung base. Pneumonia not excluded. Right lung appears clear and expanded. No pneumothorax. Calcified and tortuous aorta. IMPRESSION: Small left pleural effusion with consolidation or atelectasis in the left lower lung. Electronically Signed   By: Burman Nieves M.D.   On: 05/23/2017 02:21   Dg Pelvis 1-2 Views  Result Date: 05/23/2017 CLINICAL DATA:  Pain after a fall tonight. EXAM: PELVIS - 1-2 VIEW COMPARISON:  None. FINDINGS: Diffuse bone demineralization. Acute subcapital fracture of the right proximal femur with varus angulation. Old left hip hemiarthroplasty. Components are incompletely included within the field of view. Pelvis appears intact. SI joints and symphysis pubis are not displaced. Vascular stents, vascular coils, and vascular calcifications are present in the pelvis. IMPRESSION: Transverse subcapital fracture of the proximal right femur with varus angulation. Electronically Signed   By: Burman Nieves M.D.   On:  05/23/2017 02:22   Ct Head Wo Contrast  Result Date: 05/23/2017 CLINICAL DATA:  Fall tonight.  Right femoral fracture.  Dementia. EXAM: CT HEAD WITHOUT CONTRAST CT CERVICAL SPINE WITHOUT CONTRAST TECHNIQUE: Multidetector CT imaging of the head and cervical spine was performed following the standard protocol without intravenous contrast. Multiplanar CT image reconstructions of the cervical spine were also generated. COMPARISON:  04/06/2016 FINDINGS: CT HEAD FINDINGS Brain: Examination is technically limited due to motion artifact. Diffuse cerebral atrophy. Ventricular dilatation consistent with central atrophy. Low-attenuation changes in the deep white matter consistent with small vessel ischemia. No mass effect or midline shift. No abnormal extra-axial fluid collections. Gray-white matter junctions appear distinct. Basal cisterns are not effaced. No acute intracranial hemorrhage is identified. Vascular: Vascular calcifications are present. Skull: Calvarium appears intact. Sinuses/Orbits: Paranasal sinuses and mastoid air cells are clear. Other: None. CT CERVICAL SPINE FINDINGS Alignment: There is reversal of the usual cervical lordosis with slight anterior subluxations at C3-4 and C4-5 and slight retrolisthesis at C5-6. Alignment is unchanged since the prior study and is likely due to degenerative changes. Normal alignment of the cervical facet joints. Skull base and vertebrae: Skullbase appears intact. No vertebral compression deformities. No focal bone lesion or bone destruction. Soft tissues and spinal canal: No prevertebral soft tissue swelling. No paraspinal mass or infiltration. Disc levels: Degenerative changes diffusely throughout the cervical spine with narrowed interspaces and associated endplate hypertrophic changes. Degenerative changes throughout the facet joints. C1-2 articulation appears intact. Upper chest: Prominent pleural effusion and pleural thickening similar to previous study dated  08/15/2016. Postoperative median sternotomy. Other: None. IMPRESSION: 1. No acute intracranial abnormalities. Chronic atrophy and small vessel ischemic changes. 2. Degenerative changes throughout the cervical spine. Nonspecific reversal of the usual cervical lordosis. Alignment is unchanged since previous studies. No acute displaced cervical spine fractures identified. 3. Left pleural effusion and pleural thickening is similar to previous studies. Electronically Signed   By: Burman Nieves M.D.   On: 05/23/2017 02:44   Ct Cervical Spine Wo Contrast  Result Date: 05/23/2017 CLINICAL DATA:  Fall tonight.  Right femoral fracture.  Dementia. EXAM: CT HEAD WITHOUT CONTRAST CT CERVICAL SPINE WITHOUT CONTRAST TECHNIQUE: Multidetector CT imaging of the head and cervical spine was performed following the standard protocol without intravenous contrast. Multiplanar CT image reconstructions of the cervical spine were also generated. COMPARISON:  04/06/2016 FINDINGS: CT HEAD FINDINGS Brain: Examination is technically limited due to motion artifact. Diffuse cerebral atrophy. Ventricular dilatation consistent with central  atrophy. Low-attenuation changes in the deep white matter consistent with small vessel ischemia. No mass effect or midline shift. No abnormal extra-axial fluid collections. Gray-white matter junctions appear distinct. Basal cisterns are not effaced. No acute intracranial hemorrhage is identified. Vascular: Vascular calcifications are present. Skull: Calvarium appears intact. Sinuses/Orbits: Paranasal sinuses and mastoid air cells are clear. Other: None. CT CERVICAL SPINE FINDINGS Alignment: There is reversal of the usual cervical lordosis with slight anterior subluxations at C3-4 and C4-5 and slight retrolisthesis at C5-6. Alignment is unchanged since the prior study and is likely due to degenerative changes. Normal alignment of the cervical facet joints. Skull base and vertebrae: Skullbase appears intact.  No vertebral compression deformities. No focal bone lesion or bone destruction. Soft tissues and spinal canal: No prevertebral soft tissue swelling. No paraspinal mass or infiltration. Disc levels: Degenerative changes diffusely throughout the cervical spine with narrowed interspaces and associated endplate hypertrophic changes. Degenerative changes throughout the facet joints. C1-2 articulation appears intact. Upper chest: Prominent pleural effusion and pleural thickening similar to previous study dated 08/15/2016. Postoperative median sternotomy. Other: None. IMPRESSION: 1. No acute intracranial abnormalities. Chronic atrophy and small vessel ischemic changes. 2. Degenerative changes throughout the cervical spine. Nonspecific reversal of the usual cervical lordosis. Alignment is unchanged since previous studies. No acute displaced cervical spine fractures identified. 3. Left pleural effusion and pleural thickening is similar to previous studies. Electronically Signed   By: Burman Nieves M.D.   On: 05/23/2017 02:44   Ct Hip Right Wo Contrast  Result Date: 05/23/2017 CLINICAL DATA:  Right hip fracture for additional evaluation. EXAM: CT OF THE RIGHT HIP WITHOUT CONTRAST TECHNIQUE: Multidetector CT imaging of the right hip was performed according to the standard protocol. Multiplanar CT image reconstructions were also generated. COMPARISON:  Pelvis and right femoral radiographs 05/23/2017 FINDINGS: Bones/Joint/Cartilage Transverse subcapital fracture of the right femoral neck with varus and posterior angulation of the distal fracture fragment. Fracture lines do not extend to the inter trochanteric region. Visualized right hemipelvis and acetabulum appear intact. Incidental note of a left hip arthroplasty. Ligaments Suboptimally assessed by CT. Muscles and Tendons No intramuscular hematoma. Soft tissues Small right hip effusion. Vascular calcifications in the iliac and femoral artery's. Metallic structure  demonstrated in the right pelvis. Bladder wall is not thickened and no bladder filling defects are identified. IMPRESSION: Subcapital fracture of the right femoral neck with varus angulation. Electronically Signed   By: Burman Nieves M.D.   On: 05/23/2017 06:43   Dg Femur Min 2 Views Right  Result Date: 05/23/2017 CLINICAL DATA:  Altered mental status.  Right leg pain after a fall. EXAM: RIGHT FEMUR 2 VIEWS COMPARISON:  None. FINDINGS: Diffuse bone demineralization. Subcapital fracture of the right femoral neck with varus angulation of the fracture fragments. Right femur appears otherwise intact. Degenerative changes in the right hip joint. Right hemipelvis appears intact. Vascular stents, vascular calcifications, and vascular coils in the pelvis. IMPRESSION: Acute subcapital fracture of the right femoral neck with varus angulation. Electronically Signed   By: Burman Nieves M.D.   On: 05/23/2017 02:19      Subjective: Seen this morning. Patient sitting up in bed and eating breakfast by himself. Not speaking as he did yesterday. Does not appear in any distress.  Discharge Exam:  Vitals:   05/25/17 1417 05/25/17 2100 05/26/17 0500 05/26/17 1331  BP: 123/64 (!) 120/92 127/68 98/69  Pulse: 92 92 93 73  Resp: 18 18 18 15   Temp: 99 F (37.2 C) 99.4  F (37.4 C) 99 F (37.2 C) 99.1 F (37.3 C)  TempSrc: Oral Oral Oral Axillary  SpO2: 97% 94% 94% 95%  Weight:      Height:        General exam: Elderly male, moderately built, frail and chronically ill-looking, sitting up comfortably in bed. Respiratory system: Clear to auscultation. Respiratory effort normal. Cardiovascular system: S1 & S2 heard, RRR. No JVD, murmurs, rubs, gallops or clicks. No pedal edema. Gastrointestinal system: Abdomen is nondistended, soft and nontender. No organomegaly or masses felt. Normal bowel sounds heard. Central nervous system: Alert but not speaking today or following instructions. Mental status as  described above. No focal neurological deficits. Extremities: Has pill-rolling tremors of both hands, right >left.  Skin: No rashes, lesions or ulcers Psychiatry: Judgement and insight impaired. Mood & affect flat.     The results of significant diagnostics from this hospitalization (including imaging, microbiology, ancillary and laboratory) are listed below for reference.      Labs: CBC:  Recent Labs Lab 05/23/17 0110 05/24/17 0981 05/25/17 0629 05/26/17 0620  WBC 13.8* 8.6 9.0 6.7  NEUTROABS 12.5*  --   --   --   HGB 13.1 11.6* 11.4* 11.5*  HCT 41.0 36.9* 36.3* 36.9*  MCV 87.4 88.3 87.9 88.3  PLT 156 134* 123* 129*   Basic Metabolic Panel:  Recent Labs Lab 05/23/17 0110 05/24/17 0632  NA 137 138  K 4.1 4.3  CL 102 103  CO2 24 28  GLUCOSE 148* 112*  BUN 20 18  CREATININE 1.24 1.25*  CALCIUM 8.8* 8.6*      Time coordinating discharge: Less than 30 minutes  SIGNED:  Marcellus Scott, MD, FACP, FHM. [facility] Pager [phone]  If 7PM-7AM, please contact night-coverage [email or web address] Password [email or web address] 05/26/2017, 2:17 PM

## 2017-05-26 NOTE — Progress Notes (Signed)
[facility]  Received request from Darl PikesSusan for family interest in [facility]. Chart reviewed and spoke with spouse by phone to confirm interest and explain services. Provided her with [facility] referral center number for follow up. Appreciate help from CSW Darl PikesSusan and Dr. Waymon AmatoHongalgi for putting order in discharge summary to help facilitate referral process.  Thank you,  Forrestine Himva Haaland, LCSW [phone]

## 2017-08-05 DEATH — deceased

## 2018-06-30 IMAGING — DX DG CHEST 1V
1 series · 1 of 1 positions shown · non-contrast
Comparison: 04/14/2016

CLINICAL DATA: Altered mental status.  Pain after a fall.

EXAM:
CHEST 1 VIEW

[chest ap]
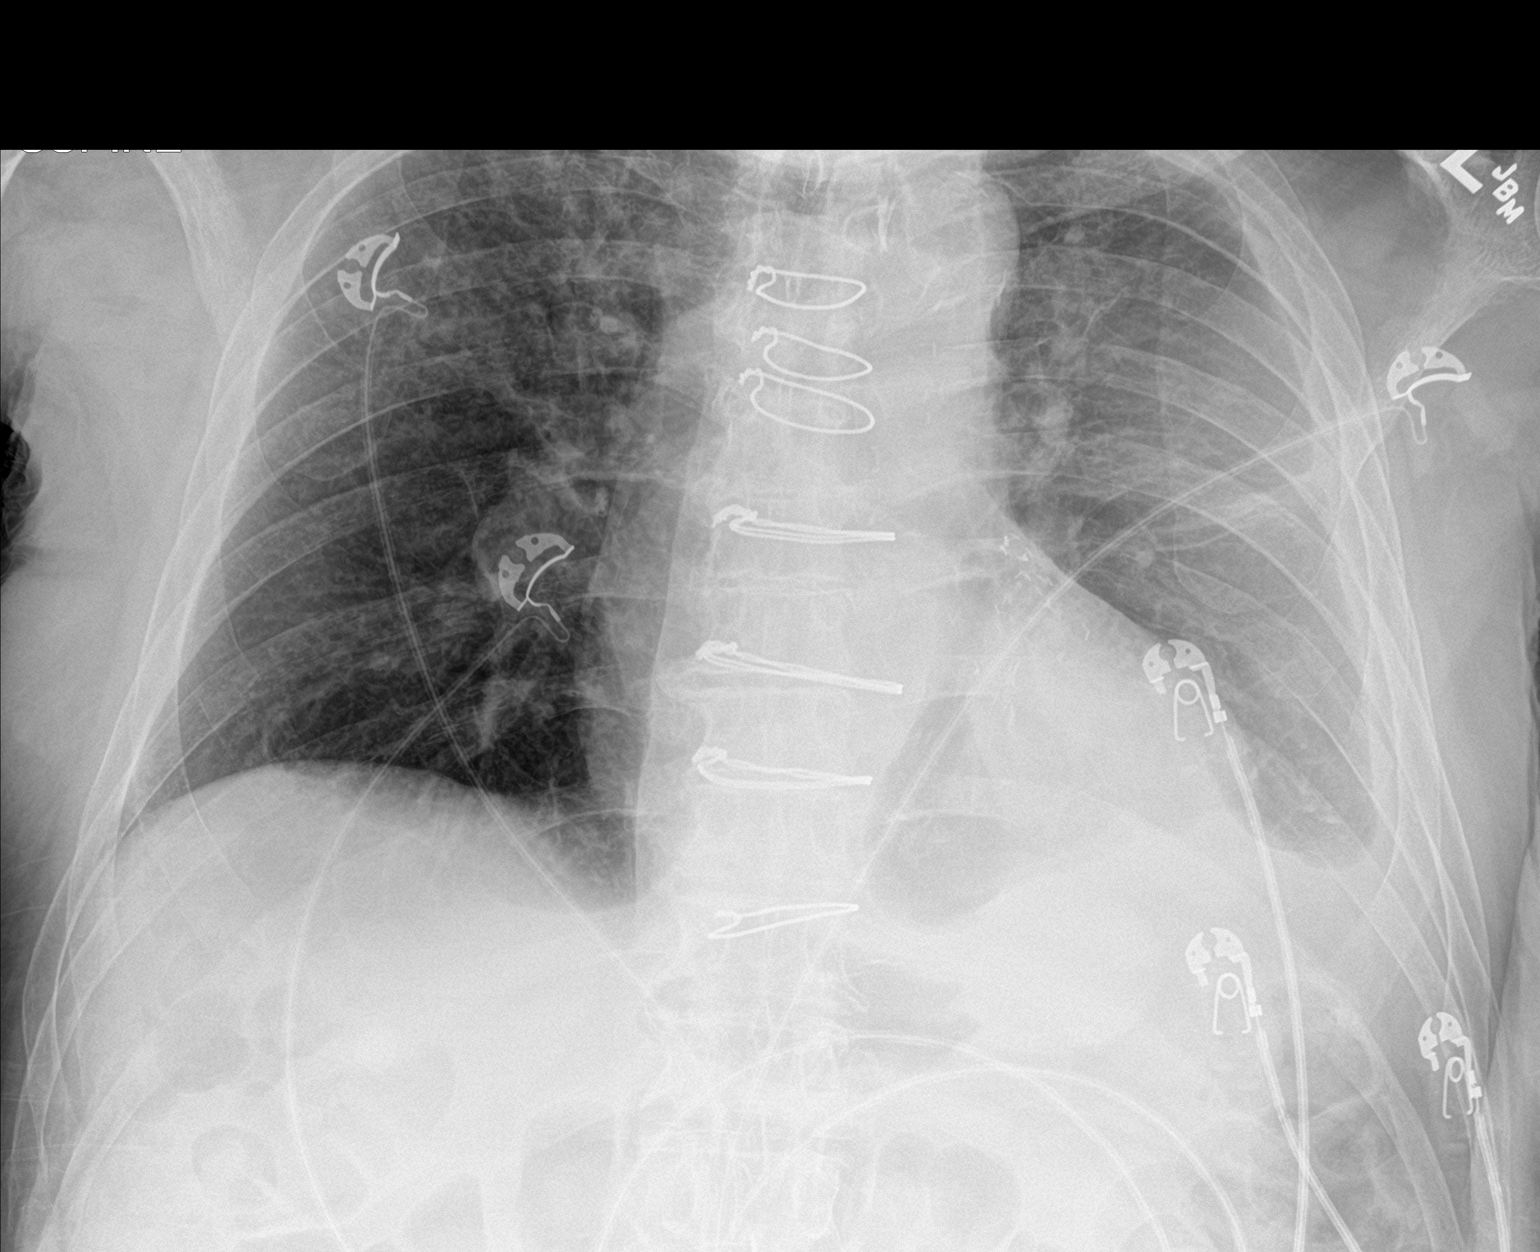

[1 of 1 positions shown; findings below may reference images not displayed]

FINDINGS: Postoperative changes in the mediastinum. Heart size and pulmonary
vascularity are normal for technique. Small left pleural effusion
with atelectasis or consolidation in the left lung base. Pneumonia
not excluded. Right lung appears clear and expanded. No
pneumothorax. Calcified and tortuous aorta.
IMPRESSION: Small left pleural effusion with consolidation or atelectasis in the
left lower lung.

## 2018-06-30 IMAGING — CT CT CERVICAL SPINE W/O CM
3 of 13 series · 6 of 33 positions shown, 7 images · non-contrast
Comparison: 04/06/2016

CLINICAL DATA: Fall tonight.  Right femoral fracture.  Dementia.

EXAM:
CT HEAD WITHOUT CONTRAST
CT CERVICAL SPINE WITHOUT CONTRAST
TECHNIQUE: Multidetector CT imaging of the head and cervical spine was
performed following the standard protocol without intravenous
contrast. Multiplanar CT image reconstructions of the cervical spine
were also generated.

[Series 6: head without cor · coronal · non-contrast · 0.35mm/px · 1 of 67 slices shown]
[im 34/67  bone]
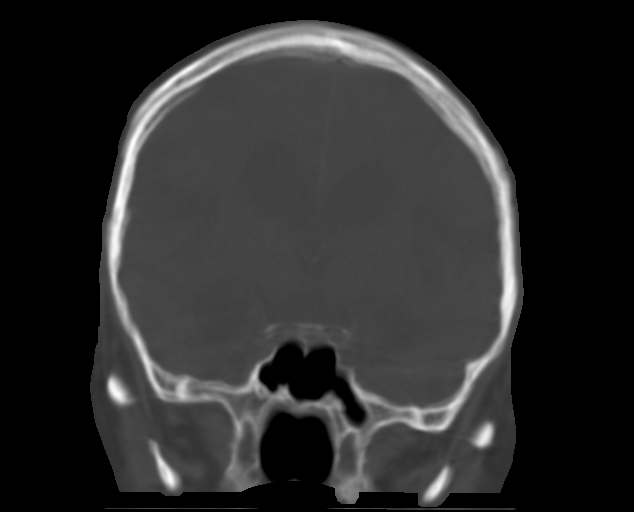

[Series 14: c_spine 2.0 sag bone · sagittal · 0.38mm/px · 2 of 61 slices shown]
[im 21/61  bone]
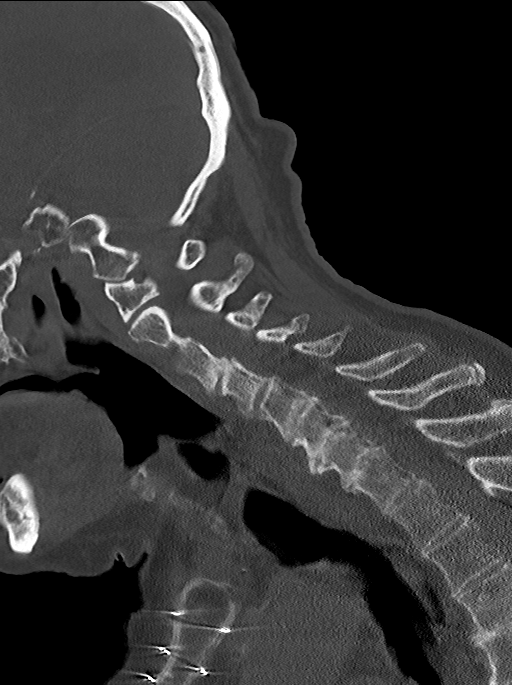
[im 41/61  bone]
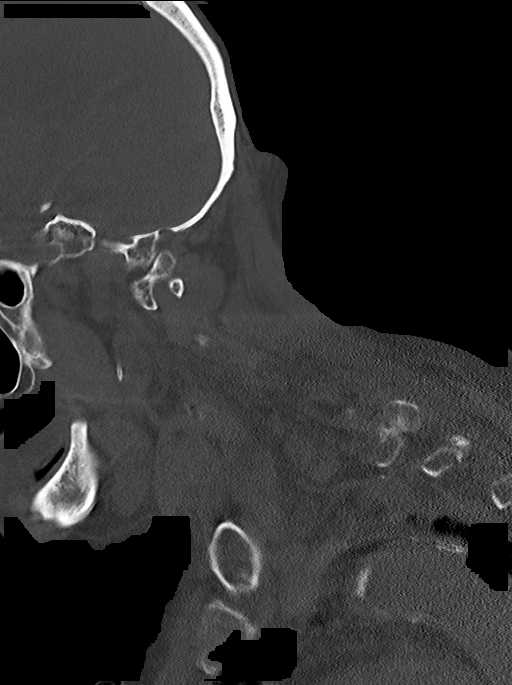

[Series 19: axial · axial · 0.40mm/px · z∈[-384,-141]mm · 3 of 148 slices shown, 4 images]
[im 1/148  soft-tissue]
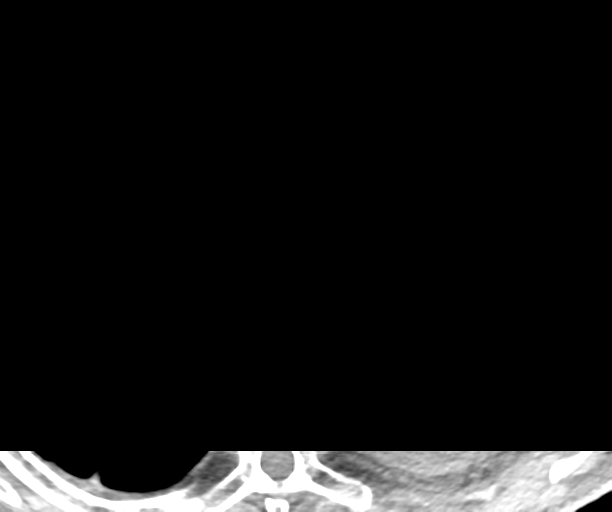
[im 1/148  bone]
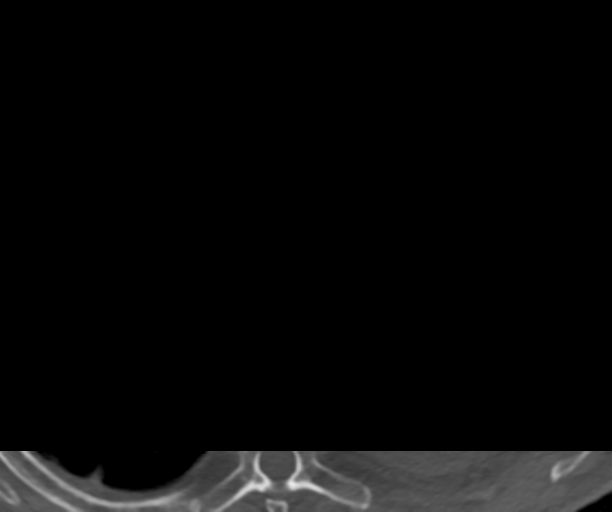
[im 74/148  bone]
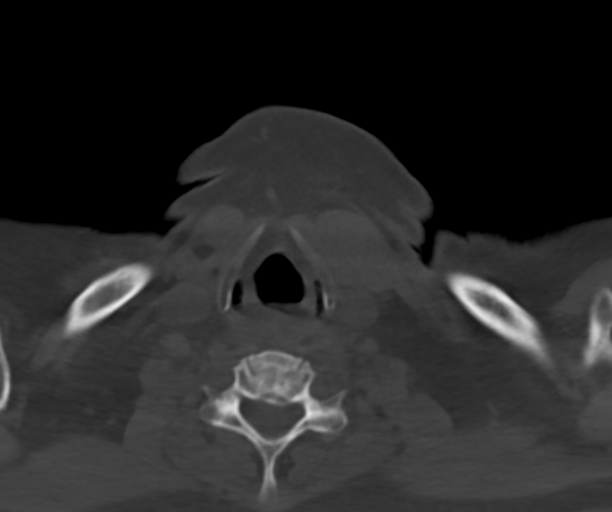
[im 148/148  bone]
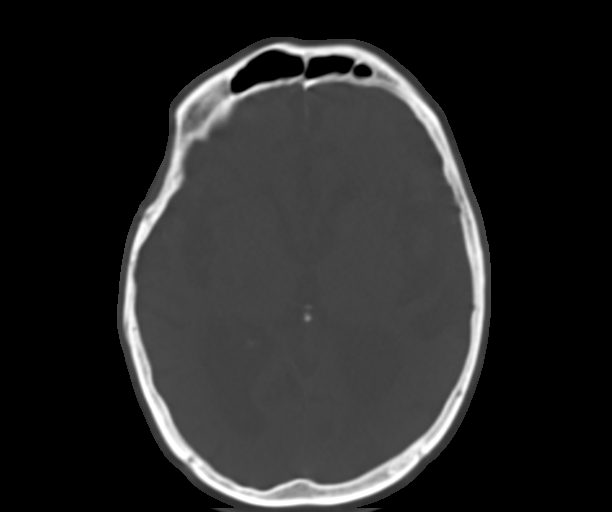

[6 of 33 positions shown; findings below may reference images not displayed]

FINDINGS: CT HEAD FINDINGS

Brain: Examination is technically limited due to motion artifact.
Diffuse cerebral atrophy. Ventricular dilatation consistent with
central atrophy. Low-attenuation changes in the deep white matter
consistent with small vessel ischemia. No mass effect or midline
shift. No abnormal extra-axial fluid collections. Gray-white matter
junctions appear distinct. Basal cisterns are not effaced. No acute
intracranial hemorrhage is identified.

Vascular: Vascular calcifications are present.

Skull: Calvarium appears intact.

Sinuses/Orbits: Paranasal sinuses and mastoid air cells are clear.

Other: None.

CT CERVICAL SPINE FINDINGS

Alignment: There is reversal of the usual cervical lordosis with
slight anterior subluxations at C3-4 and C4-5 and slight
retrolisthesis at C5-6. Alignment is unchanged since the prior study
and is likely due to degenerative changes. Normal alignment of the
cervical facet joints.

Skull base and vertebrae: Skullbase appears intact. No vertebral
compression deformities. No focal bone lesion or bone destruction.

Soft tissues and spinal canal: No prevertebral soft tissue swelling.
No paraspinal mass or infiltration.

Disc levels: Degenerative changes diffusely throughout the cervical
spine with narrowed interspaces and associated endplate hypertrophic
changes. Degenerative changes throughout the facet joints. C1-2
articulation appears intact.

Upper chest: Prominent pleural effusion and pleural thickening
similar to previous study dated 08/15/2016. Postoperative median
sternotomy.

Other: None.
IMPRESSION: 1. No acute intracranial abnormalities. Chronic atrophy and small
vessel ischemic changes.
2. Degenerative changes throughout the cervical spine. Nonspecific
reversal of the usual cervical lordosis. Alignment is unchanged
since previous studies. No acute displaced cervical spine fractures
identified.
3. Left pleural effusion and pleural thickening is similar to
previous studies.
